# Patient Record
Sex: Female | Born: 1970 | Race: White | Hispanic: No | Smoking: Former smoker
Health system: Southern US, Community
[De-identification: ages and names within clinical notes are randomized; demographics above are authoritative.]

## PROBLEM LIST (undated history)

## (undated) DIAGNOSIS — R51 Headache: Secondary | ICD-10-CM

## (undated) DIAGNOSIS — B999 Unspecified infectious disease: Secondary | ICD-10-CM

## (undated) DIAGNOSIS — C539 Malignant neoplasm of cervix uteri, unspecified: Secondary | ICD-10-CM

## (undated) DIAGNOSIS — J45909 Unspecified asthma, uncomplicated: Secondary | ICD-10-CM

## (undated) DIAGNOSIS — F419 Anxiety disorder, unspecified: Secondary | ICD-10-CM

## (undated) DIAGNOSIS — M797 Fibromyalgia: Secondary | ICD-10-CM

## (undated) HISTORY — PX: FRACTURE SURGERY: SHX138

## (undated) HISTORY — PX: ABDOMINAL HYSTERECTOMY: SHX81

## (undated) HISTORY — PX: KNEE ARTHROSCOPY: SUR90

## (undated) HISTORY — PX: DILATION AND CURETTAGE OF UTERUS: SHX78

---

## 1998-08-20 ENCOUNTER — Other Ambulatory Visit: Admission: RE | Admit: 1998-08-20 | Discharge: 1998-08-20 | Payer: Self-pay | Admitting: Obstetrics & Gynecology

## 2000-01-07 ENCOUNTER — Other Ambulatory Visit: Admission: RE | Admit: 2000-01-07 | Discharge: 2000-01-07 | Payer: Self-pay | Admitting: Obstetrics & Gynecology

## 2000-10-01 ENCOUNTER — Observation Stay (HOSPITAL_COMMUNITY): Admission: EM | Admit: 2000-10-01 | Discharge: 2000-10-02 | Payer: Self-pay

## 2000-10-05 ENCOUNTER — Other Ambulatory Visit (HOSPITAL_COMMUNITY): Admission: RE | Admit: 2000-10-05 | Discharge: 2000-10-22 | Payer: Self-pay | Admitting: Psychiatry

## 2001-03-01 ENCOUNTER — Other Ambulatory Visit: Admission: RE | Admit: 2001-03-01 | Discharge: 2001-03-01 | Payer: Self-pay | Admitting: Obstetrics & Gynecology

## 2002-06-22 ENCOUNTER — Other Ambulatory Visit: Admission: RE | Admit: 2002-06-22 | Discharge: 2002-06-22 | Payer: Self-pay | Admitting: Obstetrics & Gynecology

## 2004-03-21 ENCOUNTER — Other Ambulatory Visit: Admission: RE | Admit: 2004-03-21 | Discharge: 2004-03-21 | Payer: Self-pay | Admitting: Family Medicine

## 2004-07-18 ENCOUNTER — Ambulatory Visit: Payer: Self-pay | Admitting: Family Medicine

## 2004-07-29 ENCOUNTER — Ambulatory Visit: Payer: Self-pay | Admitting: Family Medicine

## 2004-08-01 ENCOUNTER — Ambulatory Visit: Payer: Self-pay | Admitting: Family Medicine

## 2005-05-16 ENCOUNTER — Ambulatory Visit: Payer: Self-pay | Admitting: Internal Medicine

## 2005-08-14 ENCOUNTER — Emergency Department (HOSPITAL_COMMUNITY): Admission: EM | Admit: 2005-08-14 | Discharge: 2005-08-14 | Payer: Self-pay | Admitting: Family Medicine

## 2008-09-08 ENCOUNTER — Ambulatory Visit (HOSPITAL_BASED_OUTPATIENT_CLINIC_OR_DEPARTMENT_OTHER): Admission: RE | Admit: 2008-09-08 | Discharge: 2008-09-08 | Payer: Self-pay | Admitting: Internal Medicine

## 2008-09-08 ENCOUNTER — Ambulatory Visit: Payer: Self-pay | Admitting: Diagnostic Radiology

## 2010-10-11 NOTE — Discharge Summary (Signed)
[place]  Patient:    Jacqueline Greene, Jacqueline Greene                      MRN: [id number] Adm. Date:  [id number] Disc. Date: 10/02/00 Attending:  Carolanne Grumbling D Dictator:   Cornell Barman, P.A. CC:         Evette Georges, M.D. [facility]  Adelene Amas. Williford, M.D.   Discharge Summary  DISCHARGE DIAGNOSES: 1. Suicide attempt. 2. Depression. 3. Drug overdose.  HISTORY OF PRESENT ILLNESS:  Ms. Jacqueline Greene is a 40 year old white female with a long-standing history of depression.  She presented to the emergency room after ingesting a number of medications that she had been taking for a migraine including Promethazine, butalbital and propoxyphene.  The patient had also consumed two glasses of wine earlier.  On arrival, the patient was somnolent and quite combative.  She had been treated aggressively with gastric lavage followed by charcoal.  The patient was admitted for further evaluation.  PAST MEDICAL HISTORY: 1. Long history of depression with a prior suicide attempt at age 15. 2. History of migraines. 3. Status post C-section. 4. Status post sinus surgery x 2. 5. Status post arthroscopic knee surgery.  LABORATORY DATA AND X-RAY FINDINGS:  Blood alcohol level is 35 mg/dl.  BMP was normal.  Salicylate level was less than 4.  Acetaminophen level was 53.4.  HOSPITAL COURSE:  The patient presented with a drug overdose and suicide attempt.  Because of the acetaminophen she had ingested, we did admit her for observation.  Follow up acetaminophen level on May 9, at 11:45 a.m. was 3.2 which was lower than therapeutic range.  We also repeated hepatic function profiles which were normal.  The patient was evaluated by Dr. Jeanie Sewer whose impression was that she had a major depression that was recurrent, rule out post-traumatic stress disorder and alcohol abuse.  He recommended restarting the Celexa at 10 mg q.d. and Wellbutrin 150 mg b.i.d.  He recommended admission to  a psychiatric unit for further evaluation.  DISCHARGE MEDICATIONS: 1. Celexa 2 mg q.d. 2. Wellbutrin SR 150 mg b.i.d.  SPECIAL INSTRUCTIONS:  Instructed not to take Darvocet-N 100, Promethazine or butalbital and additionally any Tylenol until further instructed.  FOLLOWUP:  She is to follow up with Dr. Tawanna Cooler in two to three weeks.  DISPOSITION:  She is to be discharged from here and refer herself for outpatient evaluation. DD:  10/02/00 TD:  10/05/00 Job: [id number]
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
## 2012-02-18 DIAGNOSIS — M1990 Unspecified osteoarthritis, unspecified site: Secondary | ICD-10-CM | POA: Insufficient documentation

## 2013-01-09 ENCOUNTER — Emergency Department (HOSPITAL_COMMUNITY)
Admission: EM | Admit: 2013-01-09 | Discharge: 2013-01-09 | Disposition: A | Discharge status: Home / Self Care | Payer: Self-pay | Source: Home / Self Care | Attending: Emergency Medicine | Admitting: Emergency Medicine

## 2013-01-09 DIAGNOSIS — L255 Unspecified contact dermatitis due to plants, except food: Secondary | ICD-10-CM

## 2013-01-09 DIAGNOSIS — L237 Allergic contact dermatitis due to plants, except food: Secondary | ICD-10-CM

## 2013-01-09 MED ORDER — METHYLPREDNISOLONE ACETATE 80 MG/ML IJ SUSP
INTRAMUSCULAR | Status: AC
  Filled 2013-01-09: qty 1

## 2013-01-09 MED ORDER — PREDNISONE 20 MG PO TABS: ORAL_TABLET | ORAL | Status: DC

## 2013-01-09 MED ORDER — HYDROCORTISONE 1 % EX CREA: TOPICAL_CREAM | CUTANEOUS | Status: DC

## 2013-01-09 MED ORDER — METHYLPREDNISOLONE ACETATE 80 MG/ML IJ SUSP
80.0000 mg | Freq: Once | INTRAMUSCULAR | Status: AC
  Administered 2013-01-09: 80 mg via INTRAMUSCULAR

## 2013-01-09 MED ORDER — TRIAMCINOLONE ACETONIDE 0.1 % EX CREA: TOPICAL_CREAM | CUTANEOUS | Status: DC

## 2013-01-09 NOTE — ED Provider Notes (Addendum)
Chief Complaint:  Rash.   History of Present Illness:   Jacqueline Greene is a 42 year old female who presents with a four-day history of a poison ivy rash. Her daughter's cat ran through poison ivy and she picked up the cat. She didn't have any direct exposure to poison ivy herself. The next day she began to break out in a rash, first on her arms, then on the right side of her neck, then around her right eye. Her right eye was swollen shut this morning. She denies any difficulty breathing, wheezing, or swelling of the lips, tongue, or throat.  Review of Systems:  Other than noted above, the patient denies any of the following symptoms: Systemic:  No fever, chills, sweats, weight loss, or fatigue. ENT:  No nasal congestion, rhinorrhea, sore throat, swelling of lips, tongue or throat. Resp:  No cough, wheezing, or shortness of breath. Skin:  No rash, itching, nodules, or suspicious lesions.  PMFSH:  Past medical history, family history, social history, meds, and allergies were reviewed. She takes BuSpar and Celexa. She has mild intermittent asthma. She is status post hysterectomy.  Physical Exam:   Vital signs:  BP 131/86  Pulse 86  Temp(Src) 98.1 F (36.7 C) (Oral)  Resp 14  SpO2 100% Gen:  Alert, oriented, in no distress. ENT:  Pharynx clear, no intraoral lesions, moist mucous membranes. Lungs:  Clear to auscultation. Skin:  She has streaks and patches of maculopapules and vesicles on both arms, the right side of her neck, and around the right eye. The eye itself appears normal. Skin was otherwise clear.  Course in Urgent Care Center:   Given Depo-Medrol 80 mg IM.  Assessment:  The encounter diagnosis was Poison ivy.  Plan:   1.  The following meds were prescribed:   New Prescriptions   HYDROCORTISONE CREAM 1 %    Apply to rash on face TID   PREDNISONE (DELTASONE) 20 MG TABLET    3 daily for 5 days, 2 daily for 5 days, 1 daily for 5 days   TRIAMCINOLONE CREAM (KENALOG) 0.1 %     Apply to rash on body TID   2.  The patient was instructed in symptomatic care and handouts were given. 3.  The patient was told to return if becoming worse in any way, if no better in 3 or 4 days, and given some red flag symptoms such as worsening rash or any difficulty breathing that would indicate earlier return. 4.  Follow up here if needed.     Reuben Likes, MD 01/09/13 0981  Reuben Likes, MD 01/09/13 [phone]

## 2013-02-17 ENCOUNTER — Inpatient Hospital Stay (HOSPITAL_COMMUNITY)
Admission: AD | Admit: 2013-02-17 | Discharge: 2013-02-17 | Disposition: A | Discharge status: Home / Self Care | Payer: Self-pay | Source: Ambulatory Visit | Attending: Obstetrics & Gynecology | Admitting: Obstetrics & Gynecology

## 2013-02-17 ENCOUNTER — Inpatient Hospital Stay (HOSPITAL_COMMUNITY): Payer: Self-pay

## 2013-02-17 ENCOUNTER — Encounter (HOSPITAL_COMMUNITY): Payer: Self-pay | Admitting: *Deleted

## 2013-02-17 DIAGNOSIS — N83201 Unspecified ovarian cyst, right side: Secondary | ICD-10-CM

## 2013-02-17 DIAGNOSIS — N898 Other specified noninflammatory disorders of vagina: Secondary | ICD-10-CM | POA: Insufficient documentation

## 2013-02-17 DIAGNOSIS — Z90710 Acquired absence of both cervix and uterus: Secondary | ICD-10-CM | POA: Insufficient documentation

## 2013-02-17 DIAGNOSIS — N949 Unspecified condition associated with female genital organs and menstrual cycle: Secondary | ICD-10-CM | POA: Insufficient documentation

## 2013-02-17 DIAGNOSIS — R109 Unspecified abdominal pain: Secondary | ICD-10-CM | POA: Insufficient documentation

## 2013-02-17 HISTORY — DX: Anxiety disorder, unspecified: F41.9

## 2013-02-17 HISTORY — DX: Malignant neoplasm of cervix uteri, unspecified: C53.9

## 2013-02-17 HISTORY — DX: Headache: R51

## 2013-02-17 HISTORY — DX: Unspecified infectious disease: B99.9

## 2013-02-17 HISTORY — DX: Unspecified asthma, uncomplicated: J45.909

## 2013-02-17 HISTORY — DX: Fibromyalgia: M79.7

## 2013-02-17 LAB — WET PREP, GENITAL: Clue Cells Wet Prep HPF POC: NONE SEEN

## 2013-02-17 LAB — URINALYSIS, ROUTINE W REFLEX MICROSCOPIC
Bilirubin Urine: NEGATIVE
Hgb urine dipstick: NEGATIVE
Ketones, ur: NEGATIVE mg/dL
Nitrite: NEGATIVE
Urobilinogen, UA: 0.2 mg/dL (ref 0.0–1.0)

## 2013-02-17 MED ORDER — IBUPROFEN 600 MG PO TABS: 600.0000 mg | ORAL_TABLET | Freq: Four times a day (QID) | ORAL | Status: DC | PRN

## 2013-02-17 NOTE — MAU Provider Note (Signed)
History     CSN: [id number]  Arrival date and time: 02/17/13 1341   First Provider Initiated Contact with Patient 02/17/13 1558      Chief Complaint  Patient presents with  . Abdominal Cramping  . Vaginal Bleeding   HPI This is a 42 y.o. female who presents with c/o having brown discharge in underwear this morning. Is convinced it was vaginal, though she has been having diarrhea for a day or so. Had a hysterectomy by Dr Shawnie Pons 4-5 yrs ago for Cervical Cancer.   RN Note: Woke up with brown/grainy D/c from vagina noted in underwear. Continued into the morning. None when went to br now. Having crampy feels puny. Hysterectomy 5 yrs for cervical cancer; changes in bleeding; Still have ovaries.      OB History   Grav Para Term Preterm Abortions TAB SAB Ect Mult Living   3 1 1  2  2   1       Past Medical History  Diagnosis Date  . Headache(784.0)   . Asthma   . Infection     UTI  . Fibromyalgia   . Cervical cancer   . Anxiety     Past Surgical History  Procedure Laterality Date  . Abdominal hysterectomy    . Cesarean section    . Dilation and curettage of uterus    . Fracture surgery      shattered knee cap  . Knee arthroscopy      Family History  Problem Relation Age of Onset  . Anxiety disorder Mother   . Hypertension Father   . Alcohol abuse Father   . Heart disease Paternal Uncle   . Cancer Maternal Grandmother     breast  . Heart disease Maternal Grandmother   . Diabetes Paternal Grandmother   . Heart disease Paternal Grandmother     History  Substance Use Topics  . Smoking status: Former Games developer  . Smokeless tobacco: Current User     Comment: e cig - 3 mg/ 4years  . Alcohol Use: No    Allergies: No Known Allergies  Prescriptions prior to admission  Medication Sig Dispense Refill  . busPIRone (BUSPAR) 10 MG tablet Take 10 mg by mouth 2 (two) times daily.      . citalopram (CELEXA) 20 MG tablet Take 20 mg by mouth at bedtime.      Marland Kitchen ibuprofen  (ADVIL,MOTRIN) 200 MG tablet Take 400-800 mg by mouth every 6 (six) hours as needed for pain.        Review of Systems  Constitutional: Negative for fever, chills and malaise/fatigue.  Gastrointestinal: Positive for diarrhea. Negative for nausea, vomiting, abdominal pain and constipation.  Genitourinary: Negative for dysuria.  Neurological: Negative for dizziness.   Physical Exam   Blood pressure 130/87, pulse 65, temperature 98.7 F (37.1 C), temperature source Oral, resp. rate 16, height 5' 9.5" (1.765 m), weight 68.493 kg (151 lb), SpO2 100.00%.  Physical Exam  Constitutional: She is oriented to person, place, and time. She appears well-developed and well-nourished.  HENT:  Head: Normocephalic.  Cardiovascular: Normal rate.   Respiratory: Effort normal.  GI: Soft. She exhibits no distension and no mass. There is no tenderness. There is no rebound and no guarding.  Genitourinary: Vaginal discharge (creamy white discharge, no colored discharge at all, cuff intact, no erethema or lesions) found.  Musculoskeletal: Normal range of motion.  Neurological: She is alert and oriented to person, place, and time.  Skin: Skin is warm and dry.  Psychiatric: She has a normal mood and affect.    MAU Course  Procedures  MDM Results for orders placed during the hospital encounter of 02/17/13 (from the past 72 hour(s))  URINALYSIS, ROUTINE W REFLEX MICROSCOPIC     Status: Abnormal   Collection Time    02/17/13  2:00 PM      Result Value Range   Color, Urine YELLOW  YELLOW   APPearance CLEAR  CLEAR   Specific Gravity, Urine >1.030 (*) 1.005 - 1.030   pH 6.0  5.0 - 8.0   Glucose, UA NEGATIVE  NEGATIVE mg/dL   Hgb urine dipstick NEGATIVE  NEGATIVE   Bilirubin Urine NEGATIVE  NEGATIVE   Ketones, ur NEGATIVE  NEGATIVE mg/dL   Protein, ur NEGATIVE  NEGATIVE mg/dL   Urobilinogen, UA 0.2  0.0 - 1.0 mg/dL   Nitrite NEGATIVE  NEGATIVE   Leukocytes, UA NEGATIVE  NEGATIVE   Comment: MICROSCOPIC  NOT DONE ON URINES WITH NEGATIVE PROTEIN, BLOOD, LEUKOCYTES, NITRITE, OR GLUCOSE <1000 mg/dL.  WET PREP, GENITAL     Status: Abnormal   Collection Time    02/17/13  4:10 PM      Result Value Range   Yeast Wet Prep HPF POC NONE SEEN  NONE SEEN   Trich, Wet Prep NONE SEEN  NONE SEEN   Clue Cells Wet Prep HPF POC NONE SEEN  NONE SEEN   WBC, Wet Prep HPF POC FEW (*) NONE SEEN   Comment: MANY BACTERIA SEEN  US Pelvis Complete  02/17/2013   CLINICAL DATA:  Pelvic pain and vaginal bleeding. Post hysterectomy.  EXAM: TRANSABDOMINAL AND TRANSVAGINAL ULTRASOUND OF PELVIS  TECHNIQUE: Both transabdominal and transvaginal ultrasound examinations of the pelvis were performed. Transabdominal technique was performed for global imaging of the pelvis including uterus, ovaries, adnexal regions, and pelvic cul-de-sac. It was necessary to proceed with endovaginal exam following the transabdominal exam to visualize the ovaries and adnexa.  COMPARISON:  No similar comparison exam.  FINDINGS: Uterus  Measurements: Surgically absent. No mass at the vaginal cuff.  Endometrium  Thickness: Surgically absent.  Right ovary  Measurements: 2.4 x 1.9 x 1.5 cm. Follicles are incidentally noted.  Left ovary  Measurements: 1.6 x 1.2 x 1.0 cm. Follicles are incidentally noted.  Other findings  Trace free fluid noted.  IMPRESSION: Normal post hysterectomy appearance. No acute abnormality.   Electronically Signed   By: Christiana Pellant M.D.   On: 02/17/2013 19:40     Assessment and Plan  A:  Unknown source of brown discharge      Suspect this was from diarrhea  P:  Discharge home       Reassured probably diarrhea       IBS discussed, recommend starting probiotics  [facility] 02/17/2013, 4:19 PM

## 2013-02-17 NOTE — MAU Note (Signed)
Patient states she had a hysterectomy 5 years ago. States she started having abdominal cramping and states her panties were full of coffee ground dark blood with a "sour" smell. States the cramping continues but sure of bleeding.

## 2013-02-17 NOTE — MAU Note (Signed)
Woke up with brown/grainy  D/c from vagina noted in underwear. Continued into the morning. None when went to br now.  Having crampy feels puny.  Hysterectomy 5 yrs for cervical cancer; changes in bleeding;  Still have ovaries.

## 2013-02-21 NOTE — MAU Provider Note (Signed)
Attestation of Attending Supervision of Advanced Practitioner (PA/CNM/NP): Evaluation and management procedures were performed by the Advanced Practitioner under my supervision and collaboration.  I have reviewed the Advanced Practitioner's note and chart, and I agree with the management and plan.  Zanayah Shadowens, MD, FACOG Attending Obstetrician & Gynecologist Faculty Practice, [facility]  

## 2014-03-27 ENCOUNTER — Encounter (HOSPITAL_COMMUNITY): Payer: Self-pay | Admitting: *Deleted

## 2015-10-22 ENCOUNTER — Emergency Department (HOSPITAL_COMMUNITY): Payer: No Typology Code available for payment source

## 2015-10-22 ENCOUNTER — Inpatient Hospital Stay (HOSPITAL_COMMUNITY)
Admission: EM | Admit: 2015-10-22 | Discharge: 2015-10-26 | DRG: 885 | Disposition: A | Discharge status: Other Acute Inpatient Hospital | Payer: No Typology Code available for payment source | Attending: Internal Medicine | Admitting: Internal Medicine

## 2015-10-22 ENCOUNTER — Encounter (HOSPITAL_COMMUNITY): Payer: Self-pay | Admitting: *Deleted

## 2015-10-22 DIAGNOSIS — F429 Obsessive-compulsive disorder, unspecified: Secondary | ICD-10-CM | POA: Diagnosis present

## 2015-10-22 DIAGNOSIS — T424X2A Poisoning by benzodiazepines, intentional self-harm, initial encounter: Secondary | ICD-10-CM | POA: Diagnosis present

## 2015-10-22 DIAGNOSIS — G312 Degeneration of nervous system due to alcohol: Secondary | ICD-10-CM | POA: Diagnosis present

## 2015-10-22 DIAGNOSIS — Z8541 Personal history of malignant neoplasm of cervix uteri: Secondary | ICD-10-CM

## 2015-10-22 DIAGNOSIS — Z833 Family history of diabetes mellitus: Secondary | ICD-10-CM

## 2015-10-22 DIAGNOSIS — G4700 Insomnia, unspecified: Secondary | ICD-10-CM | POA: Diagnosis present

## 2015-10-22 DIAGNOSIS — R402432 Glasgow coma scale score 3-8, at arrival to emergency department: Secondary | ICD-10-CM | POA: Diagnosis present

## 2015-10-22 DIAGNOSIS — Y908 Blood alcohol level of 240 mg/100 ml or more: Secondary | ICD-10-CM | POA: Diagnosis present

## 2015-10-22 DIAGNOSIS — Z720 Tobacco use: Secondary | ICD-10-CM | POA: Diagnosis not present

## 2015-10-22 DIAGNOSIS — I952 Hypotension due to drugs: Secondary | ICD-10-CM | POA: Diagnosis present

## 2015-10-22 DIAGNOSIS — F419 Anxiety disorder, unspecified: Secondary | ICD-10-CM | POA: Diagnosis present

## 2015-10-22 DIAGNOSIS — F319 Bipolar disorder, unspecified: Secondary | ICD-10-CM

## 2015-10-22 DIAGNOSIS — T1491 Suicide attempt: Secondary | ICD-10-CM

## 2015-10-22 DIAGNOSIS — Z818 Family history of other mental and behavioral disorders: Secondary | ICD-10-CM | POA: Diagnosis not present

## 2015-10-22 DIAGNOSIS — Z915 Personal history of self-harm: Secondary | ICD-10-CM

## 2015-10-22 DIAGNOSIS — Z8249 Family history of ischemic heart disease and other diseases of the circulatory system: Secondary | ICD-10-CM | POA: Diagnosis not present

## 2015-10-22 DIAGNOSIS — Z803 Family history of malignant neoplasm of breast: Secondary | ICD-10-CM

## 2015-10-22 DIAGNOSIS — E876 Hypokalemia: Secondary | ICD-10-CM | POA: Diagnosis present

## 2015-10-22 DIAGNOSIS — T43222A Poisoning by selective serotonin reuptake inhibitors, intentional self-harm, initial encounter: Secondary | ICD-10-CM | POA: Diagnosis present

## 2015-10-22 DIAGNOSIS — F1010 Alcohol abuse, uncomplicated: Secondary | ICD-10-CM | POA: Diagnosis present

## 2015-10-22 DIAGNOSIS — J9601 Acute respiratory failure with hypoxia: Secondary | ICD-10-CM | POA: Diagnosis present

## 2015-10-22 DIAGNOSIS — I4581 Long QT syndrome: Secondary | ICD-10-CM | POA: Diagnosis present

## 2015-10-22 DIAGNOSIS — F314 Bipolar disorder, current episode depressed, severe, without psychotic features: Secondary | ICD-10-CM | POA: Diagnosis present

## 2015-10-22 DIAGNOSIS — G92 Toxic encephalopathy: Secondary | ICD-10-CM | POA: Diagnosis present

## 2015-10-22 DIAGNOSIS — J9602 Acute respiratory failure with hypercapnia: Secondary | ICD-10-CM

## 2015-10-22 DIAGNOSIS — T426X2A Poisoning by other antiepileptic and sedative-hypnotic drugs, intentional self-harm, initial encounter: Secondary | ICD-10-CM | POA: Diagnosis present

## 2015-10-22 DIAGNOSIS — Z811 Family history of alcohol abuse and dependence: Secondary | ICD-10-CM

## 2015-10-22 DIAGNOSIS — Z79899 Other long term (current) drug therapy: Secondary | ICD-10-CM

## 2015-10-22 DIAGNOSIS — J9600 Acute respiratory failure, unspecified whether with hypoxia or hypercapnia: Secondary | ICD-10-CM

## 2015-10-22 DIAGNOSIS — T510X1A Toxic effect of ethanol, accidental (unintentional), initial encounter: Secondary | ICD-10-CM | POA: Diagnosis present

## 2015-10-22 DIAGNOSIS — T50902A Poisoning by unspecified drugs, medicaments and biological substances, intentional self-harm, initial encounter: Secondary | ICD-10-CM | POA: Diagnosis present

## 2015-10-22 DIAGNOSIS — J45909 Unspecified asthma, uncomplicated: Secondary | ICD-10-CM | POA: Diagnosis present

## 2015-10-22 LAB — COMPREHENSIVE METABOLIC PANEL
ALK PHOS: 79 U/L (ref 38–126)
ALT: 33 U/L (ref 14–54)
ALT: 25 U/L (ref 14–54)
ANION GAP: 9 (ref 5–15)
AST: 37 U/L (ref 15–41)
AST: 24 U/L (ref 15–41)
Albumin: 4 g/dL (ref 3.5–5.0)
Albumin: 3.3 g/dL — ABNORMAL LOW (ref 3.5–5.0)
Alkaline Phosphatase: 98 U/L (ref 38–126)
Anion gap: 6 (ref 5–15)
BILIRUBIN TOTAL: 0.8 mg/dL (ref 0.3–1.2)
BILIRUBIN TOTAL: 0.6 mg/dL (ref 0.3–1.2)
BUN: 10 mg/dL (ref 6–20)
BUN: 8 mg/dL (ref 6–20)
CALCIUM: 7 mg/dL — AB (ref 8.9–10.3)
CHLORIDE: 106 mmol/L (ref 101–111)
CO2: 24 mmol/L (ref 22–32)
CO2: 21 mmol/L — ABNORMAL LOW (ref 22–32)
CREATININE: 0.77 mg/dL (ref 0.44–1.00)
Calcium: 8.3 mg/dL — ABNORMAL LOW (ref 8.9–10.3)
Chloride: 118 mmol/L — ABNORMAL HIGH (ref 101–111)
Creatinine, Ser: 0.83 mg/dL (ref 0.44–1.00)
GFR calc Af Amer: >60 mL/min (ref >60)
GFR calc Af Amer: >60 mL/min (ref >60)
Glucose, Bld: 142 mg/dL — ABNORMAL HIGH (ref 65–99)
Glucose, Bld: 120 mg/dL — ABNORMAL HIGH (ref 65–99)
POTASSIUM: 3.8 mmol/L (ref 3.5–5.1)
POTASSIUM: 4 mmol/L (ref 3.5–5.1)
Sodium: 139 mmol/L (ref 135–145)
Sodium: 145 mmol/L (ref 135–145)
TOTAL PROTEIN: 7.6 g/dL (ref 6.5–8.1)
TOTAL PROTEIN: 5.9 g/dL — AB (ref 6.5–8.1)

## 2015-10-22 LAB — CBC WITH DIFFERENTIAL/PLATELET
BASOS ABS: 0.1 10*3/uL (ref 0.0–0.1)
Basophils Relative: 1 %
EOS PCT: 6 %
Eosinophils Absolute: 0.6 10*3/uL (ref 0.0–0.7)
HEMATOCRIT: 42.5 % (ref 36.0–46.0)
HEMOGLOBIN: 14.9 g/dL (ref 12.0–15.0)
LYMPHS ABS: 4.9 10*3/uL — AB (ref 0.7–4.0)
LYMPHS PCT: 48 %
MCH: 30.5 pg (ref 26.0–34.0)
MCHC: 35.1 g/dL (ref 30.0–36.0)
MCV: 86.9 fL (ref 78.0–100.0)
Monocytes Absolute: 0.7 10*3/uL (ref 0.1–1.0)
Monocytes Relative: 7 %
NEUTROS ABS: 3.8 10*3/uL (ref 1.7–7.7)
NEUTROS PCT: 38 %
PLATELETS: 248 10*3/uL (ref 150–400)
RBC: 4.89 MIL/uL (ref 3.87–5.11)
RDW: 13.2 % (ref 11.5–15.5)
WBC: 10 10*3/uL (ref 4.0–10.5)

## 2015-10-22 LAB — I-STAT CHEM 8, ED
BUN: 12 mg/dL (ref 6–20)
CALCIUM ION: 1 mmol/L — AB (ref 1.12–1.23)
CREATININE: 1.2 mg/dL — AB (ref 0.44–1.00)
Chloride: 106 mmol/L (ref 101–111)
Glucose, Bld: 136 mg/dL — ABNORMAL HIGH (ref 65–99)
HEMATOCRIT: 42 % (ref 36.0–46.0)
HEMOGLOBIN: 14.3 g/dL (ref 12.0–15.0)
Potassium: 4.3 mmol/L (ref 3.5–5.1)
SODIUM: 143 mmol/L (ref 135–145)
TCO2: 24 mmol/L (ref 0–100)

## 2015-10-22 LAB — URINALYSIS, ROUTINE W REFLEX MICROSCOPIC
Bilirubin Urine: NEGATIVE
Glucose, UA: NEGATIVE mg/dL
HGB URINE DIPSTICK: NEGATIVE
Ketones, ur: NEGATIVE mg/dL
NITRITE: NEGATIVE
PROTEIN: NEGATIVE mg/dL
SPECIFIC GRAVITY, URINE: 1.006 (ref 1.005–1.030)
pH: 5 (ref 5.0–8.0)

## 2015-10-22 LAB — BLOOD GAS, ARTERIAL
Acid-base deficit: 2.7 mmol/L — ABNORMAL HIGH (ref 0.0–2.0)
Bicarbonate: 22.4 mEq/L (ref 20.0–24.0)
DRAWN BY: 276051
FIO2: 0.5
MECHVT: 530 mL
O2 Saturation: 96.8 %
PATIENT TEMPERATURE: 95.4
PCO2 ART: 38.6 mmHg (ref 35.0–45.0)
PEEP: 5 cmH2O
PO2 ART: 94.3 mmHg (ref 80.0–100.0)
RATE: 14 resp/min
TCO2: 20.2 mmol/L (ref 0–100)
pH, Arterial: 7.371 (ref 7.350–7.450)

## 2015-10-22 LAB — RAPID URINE DRUG SCREEN, HOSP PERFORMED
AMPHETAMINES: NOT DETECTED
BENZODIAZEPINES: POSITIVE — AB
Barbiturates: NOT DETECTED
Cocaine: NOT DETECTED
OPIATES: NOT DETECTED
Tetrahydrocannabinol: NOT DETECTED

## 2015-10-22 LAB — AMMONIA: Ammonia: 38 umol/L — ABNORMAL HIGH (ref 9–35)

## 2015-10-22 LAB — I-STAT CG4 LACTIC ACID, ED: Lactic Acid, Venous: 2.99 mmol/L (ref 0.5–2.0)

## 2015-10-22 LAB — URINE MICROSCOPIC-ADD ON

## 2015-10-22 LAB — ETHANOL: Alcohol, Ethyl (B): 271 mg/dL — ABNORMAL HIGH (ref <5)

## 2015-10-22 LAB — SALICYLATE LEVEL

## 2015-10-22 LAB — PROCALCITONIN

## 2015-10-22 LAB — PHOSPHORUS: Phosphorus: 2.6 mg/dL (ref 2.5–4.6)

## 2015-10-22 LAB — MRSA PCR SCREENING: MRSA BY PCR: NEGATIVE

## 2015-10-22 LAB — ACETAMINOPHEN LEVEL

## 2015-10-22 LAB — MAGNESIUM: MAGNESIUM: 2.4 mg/dL (ref 1.7–2.4)

## 2015-10-22 LAB — CBG MONITORING, ED: GLUCOSE-CAPILLARY: 151 mg/dL — AB (ref 65–99)

## 2015-10-22 MED ORDER — DEXTROSE 5 % IV SOLN
30.0000 ug/min | INTRAVENOUS | Status: DC
  Administered 2015-10-22: 25 ug/min via INTRAVENOUS
  Filled 2015-10-22: qty 4

## 2015-10-22 MED ORDER — PROPOFOL 1000 MG/100ML IV EMUL: 0.0000 ug/kg/min | INTRAVENOUS | Status: DC

## 2015-10-22 MED ORDER — FENTANYL CITRATE (PF) 100 MCG/2ML IJ SOLN
100.0000 ug | INTRAMUSCULAR | Status: AC | PRN
  Administered 2015-10-22 – 2015-10-23 (×3): 100 ug via INTRAVENOUS
  Filled 2015-10-22 (×3): qty 2

## 2015-10-22 MED ORDER — SODIUM CHLORIDE 0.9 % IV SOLN: 250.0000 mL | INTRAVENOUS | Status: DC | PRN

## 2015-10-22 MED ORDER — SODIUM CHLORIDE 0.9 % IV BOLUS (SEPSIS)
1000.0000 mL | Freq: Once | INTRAVENOUS | Status: AC
  Administered 2015-10-22: 1000 mL via INTRAVENOUS

## 2015-10-22 MED ORDER — PHENYLEPHRINE HCL 10 MG/ML IJ SOLN
30.0000 ug/min | INTRAVENOUS | Status: DC
  Administered 2015-10-22: 30 ug/min via INTRAVENOUS
  Filled 2015-10-22 (×3): qty 1

## 2015-10-22 MED ORDER — ENOXAPARIN SODIUM 40 MG/0.4ML ~~LOC~~ SOLN
40.0000 mg | SUBCUTANEOUS | Status: DC
  Administered 2015-10-22 – 2015-10-25 (×4): 40 mg via SUBCUTANEOUS
  Filled 2015-10-22 (×4): qty 0.4

## 2015-10-22 MED ORDER — SODIUM CHLORIDE 0.9 % IV BOLUS (SEPSIS): 1000.0000 mL | Freq: Once | INTRAVENOUS | Status: DC

## 2015-10-22 MED ORDER — FENTANYL CITRATE (PF) 100 MCG/2ML IJ SOLN: 100.0000 ug | INTRAMUSCULAR | Status: DC | PRN

## 2015-10-22 MED ORDER — ANTISEPTIC ORAL RINSE SOLUTION (CORINZ)
7.0000 mL | Freq: Four times a day (QID) | OROMUCOSAL | Status: DC
  Administered 2015-10-22 – 2015-10-23 (×3): 7 mL via OROMUCOSAL

## 2015-10-22 MED ORDER — SODIUM CHLORIDE 0.9 % IV SOLN
Freq: Once | INTRAVENOUS | Status: AC
  Administered 2015-10-22: 12:00:00 via INTRAVENOUS

## 2015-10-22 MED ORDER — CHLORHEXIDINE GLUCONATE 0.12% ORAL RINSE (MEDLINE KIT)
15.0000 mL | Freq: Two times a day (BID) | OROMUCOSAL | Status: DC
Start: 2015-10-22 — End: 2015-10-23
  Administered 2015-10-22 – 2015-10-23 (×4): 15 mL via OROMUCOSAL

## 2015-10-22 MED ORDER — ONDANSETRON HCL 4 MG/2ML IJ SOLN: 4.0000 mg | Freq: Four times a day (QID) | INTRAMUSCULAR | Status: DC | PRN

## 2015-10-22 MED ORDER — ALBUTEROL SULFATE (2.5 MG/3ML) 0.083% IN NEBU: 2.5000 mg | INHALATION_SOLUTION | RESPIRATORY_TRACT | Status: DC | PRN

## 2015-10-22 MED ORDER — MAGNESIUM SULFATE 2 GM/50ML IV SOLN
2.0000 g | Freq: Once | INTRAVENOUS | Status: AC
  Administered 2015-10-22: 2 g via INTRAVENOUS
  Filled 2015-10-22: qty 50

## 2015-10-22 MED ORDER — NALOXONE HCL 2 MG/2ML IJ SOSY
2.0000 mg | PREFILLED_SYRINGE | Freq: Once | INTRAMUSCULAR | Status: AC
  Administered 2015-10-22: 2 mg via INTRAVENOUS

## 2015-10-22 MED ORDER — THIAMINE HCL 100 MG/ML IJ SOLN
100.0000 mg | Freq: Every day | INTRAMUSCULAR | Status: DC
  Administered 2015-10-22 – 2015-10-25 (×4): 100 mg via INTRAVENOUS
  Filled 2015-10-22 (×4): qty 2

## 2015-10-22 MED ORDER — FENTANYL CITRATE (PF) 100 MCG/2ML IJ SOLN
100.0000 ug | INTRAMUSCULAR | Status: DC | PRN
Start: 2015-10-22 — End: 2015-10-23
  Administered 2015-10-22 – 2015-10-23 (×4): 100 ug via INTRAVENOUS
  Filled 2015-10-22 (×4): qty 2

## 2015-10-22 MED ORDER — FOLIC ACID 5 MG/ML IJ SOLN
1.0000 mg | Freq: Every day | INTRAMUSCULAR | Status: DC
  Administered 2015-10-22 – 2015-10-25 (×4): 1 mg via INTRAVENOUS
  Filled 2015-10-22 (×4): qty 0.2

## 2015-10-22 MED ORDER — SODIUM CHLORIDE 0.9 % IV SOLN
INTRAVENOUS | Status: DC
  Administered 2015-10-22: 1000 mL via INTRAVENOUS
  Administered 2015-10-23 – 2015-10-26 (×9): via INTRAVENOUS

## 2015-10-22 MED ORDER — ACETAMINOPHEN 325 MG PO TABS
650.0000 mg | ORAL_TABLET | ORAL | Status: DC | PRN
  Administered 2015-10-22 – 2015-10-25 (×2): 650 mg via ORAL
  Filled 2015-10-22 (×3): qty 2

## 2015-10-22 MED ORDER — ACTIDOSE WITH SORBITOL 50 GM/240ML PO LIQD
100.0000 g | Freq: Once | ORAL | Status: AC
  Administered 2015-10-22: 100 g
  Filled 2015-10-22: qty 480

## 2015-10-22 MED ORDER — ROCURONIUM BROMIDE 50 MG/5ML IV SOLN
85.0000 mg | Freq: Once | INTRAVENOUS | Status: DC
  Filled 2015-10-22: qty 8.5

## 2015-10-22 MED ORDER — DEXMEDETOMIDINE HCL IN NACL 200 MCG/50ML IV SOLN
0.0000 ug/kg/h | INTRAVENOUS | Status: DC
  Administered 2015-10-22 (×2): 0.4 ug/kg/h via INTRAVENOUS
  Filled 2015-10-22 (×2): qty 50

## 2015-10-22 MED ORDER — FAMOTIDINE IN NACL 20-0.9 MG/50ML-% IV SOLN
20.0000 mg | Freq: Two times a day (BID) | INTRAVENOUS | Status: DC
  Administered 2015-10-22 – 2015-10-25 (×7): 20 mg via INTRAVENOUS
  Filled 2015-10-22 (×7): qty 50

## 2015-10-22 MED ORDER — ETOMIDATE 2 MG/ML IV SOLN: 20.0000 mg | Freq: Once | INTRAVENOUS | Status: DC

## 2015-10-22 NOTE — Progress Notes (Signed)
Initial Nutrition Assessment  DOCUMENTATION CODES:   Obesity unspecified  INTERVENTION:  - If pt to remain intubated >/= 24 hours, recommend Vital AF 1.2 @ 45 mL/hr with 30 mL Prostat BID; this regimen will provide 1496 kcal, 111 grams of protein, and 876 mL free water. - RD will follow-up 5/30  NUTRITION DIAGNOSIS:   Inadequate oral intake related to inability to eat as evidenced by NPO status.  GOAL:   Patient will meet greater than or equal to 90% of their needs  MONITOR:   Vent status, Weight trends, Labs, I & O's  REASON FOR ASSESSMENT:   Ventilator  ASSESSMENT:   45 year old female with a past medical history significant for alcohol abuse, bipolar disorder and a history of suicide attempt in the past who was brought to the [facility] emergency department on 10/22/2015. She had been struggling with insomnia and her family felt that her bipolar disorder was not very well controlled in the last several weeks. However, they had no suspicion of her considering suicide. They had been in contact with her as recent as late in the evening on 10/21/2015. Her sister-in-law received a text message at 7:03 AM on 10/22/2015 indicating that she had made a suicide attempt. She did not reveal which medication she took. There were empty bottles of Klonopin and Lamictal noted nearby. She also had prescriptions for buspirone, as omeprazole, naproxen, and fluoxetine.  Pt seen for new vent. BMI indicates obesity. Spoke with ex-sister-in-law who is at bedside. She reports that pt had a good appetite PTA and focused on eating healthy foods and homeopathy. She states that medications were recently adjusted and that pt had been gaining weight related to this; she is unsure of pt's UBW or amount of weight gained.   Patient is currently intubated on ventilator support with OGT in place.  MV: 7.4 L/min Temp (24hrs), Avg:95.7 F (35.4 C), Min:95.2 F (35.1 C), Max:96.3 F (35.7 C) Propofol:  none  Did not perform physical assessment at this time with bair hugger in place; will complete tomorrow if able. No recent weight hx available in the chart (last recorded weight was from 2014). TF recommendations outlined above should they be needed. Medications reviewed. Labs reviewed; creatinine elevated, ionized Ca low: 1 mmol/L, ammonia elevated: 3.8 umol/L.  IVF: NS @ 150 mL/hr. Drip: Neo @ 30 mcg/min.    Diet Order:  Diet NPO time specified  Skin:  Reviewed, no issues  Last BM:  PTA  Height:   Ht Readings from Last 1 Encounters:  10/22/15 5\' 4"  (1.626 m)    Weight:   Wt Readings from Last 1 Encounters:  10/22/15 182 lb 15.7 oz (83 kg)    Ideal Body Weight:  54.54 kg (kg)  BMI:  Body mass index is 31.39 kg/(m^2).  Estimated Nutritional Needs:   Kcal:  1460  Protein:  100-124 grams (1.2-1.5 grams/kg)  Fluid:  >/= 1.5 L/day  EDUCATION NEEDS:   No education needs identified at this time     Jarome Matin, RD, LDN Inpatient Clinical Dietitian Pager # [phone] After hours/weekend pager # [phone]

## 2015-10-22 NOTE — ED Notes (Addendum)
Lactic acid i stAT pending due to equipment error. Will redraw and run test. Tech and Rn aware.

## 2015-10-22 NOTE — Progress Notes (Signed)
eLink Physician-Brief Progress Note Patient Name: YAIRETH JOPLIN DOB: [birth year] MRN: [id number]   Date of Service  10/22/2015  HPI/Events of Note  Ow[place] Shark secretions in ETT. ? aspiration   eICU Interventions  Check cultures, procalcitonin, repeat CXR. Low threshold to start antibiotics.     Intervention Category Intermediate Interventions: Other:  Saladin Petrelli 10/22/2015, 7:26 PM

## 2015-10-22 NOTE — ED Notes (Signed)
MD at bedside. 

## 2015-10-22 NOTE — H&P (Signed)
PULMONARY / CRITICAL CARE MEDICINE   Name: Jacqueline Greene MRN: [id number] DOB: Aug 05, 1970    ADMISSION DATE:  10/22/2015 CONSULTATION DATE:  10/22/2015  REFERRING MD:  ER physician  CHIEF COMPLAINT:  Found down, drug overdose  HISTORY OF PRESENT ILLNESS:   This is a 45 year old female with a past medical history significant for alcohol abuse, bipolar disorder and a history of suicide attempt in the past who was brought to the [facility] emergency department on 10/22/2015. She had been struggling with insomnia and her family felt that her bipolar disorder was not very well controlled in the last several weeks. However, they had no suspicion of her considering suicide. They had been in contact with her as recent as late in the evening on 10/21/2015. Her sister-in-law received a text message at 7:03 AM on 10/22/2015 indicating that she had made a suicide attempt. She did not reveal which medication she took. There were empty bottles of Klonopin and Lamictal noted nearby. She also had prescriptions for buspirone, as omeprazole, naproxen, and fluoxetine.  PAST MEDICAL HISTORY :  She  has a past medical history of Headache(784.0); Asthma; Infection; Fibromyalgia; Cervical cancer ([place]); and Anxiety.  PAST SURGICAL HISTORY: She  has past surgical history that includes Abdominal hysterectomy; Cesarean section; Dilation and curettage of uterus; Fracture surgery; and Knee arthroscopy.  No Known Allergies  No current facility-administered medications on file prior to encounter.   Current Outpatient Prescriptions on File Prior to Encounter  Medication Sig  . busPIRone (BUSPAR) 10 MG tablet Take 10 mg by mouth 2 (two) times daily.  . citalopram (CELEXA) 20 MG tablet Take 20 mg by mouth at bedtime.  Marland Kitchen ibuprofen (ADVIL,MOTRIN) 200 MG tablet Take 400-800 mg by mouth every 6 (six) hours as needed for pain.  Marland Kitchen ibuprofen (ADVIL,MOTRIN) 600 MG tablet Take 1 tablet (600 mg total) by mouth every 6 (six)  hours as needed for pain.    FAMILY HISTORY/SOCIAL HISTORY/REVIEW OF SYSTEMS:   Cannot obtain due to intubation  SUBJECTIVE:  As above  VITAL SIGNS: BP 85/58 mmHg  Pulse 78  Temp(Src) 95.4 F (35.2 C)  Resp 14  Ht 5\' 9"  (1.753 m)  SpO2 100%  LMP   HEMODYNAMICS:    VENTILATOR SETTINGS: Vent Mode:  [-] PRVC FiO2 (%):  [50 %-100 %] 100 % Set Rate:  [14 bmp] 14 bmp Vt Set:  [530 mL] 530 mL PEEP:  [5 cmH20] 5 cmH20 Plateau Pressure:  [18 cmH20] 18 cmH20  INTAKE / OUTPUT:    PHYSICAL EXAMINATION: General:  On vent Neuro:  No response to external stimuli HEENT:  NCAT PERRL, ETT in place Cardiovascular:  RRR, no mgr Lungs:  CTA A, vent supported breaths Abdomen:  BS+, soft, nontender Musculoskeletal:  Normal bulk and tone Skin:  No rash or skin breakdown  LABS:  BMET  Recent Labs Lab 10/22/15 0844 10/22/15 0855  NA 139 143  K 3.8 4.3  CL 106 106  CO2 24  --   BUN 10 12  CREATININE 0.83 1.20*  GLUCOSE 142* 136*    Electrolytes  Recent Labs Lab 10/22/15 0844  CALCIUM 8.3*    CBC  Recent Labs Lab 10/22/15 0844 10/22/15 0855  WBC 10.0  --   HGB 14.9 14.3  HCT 42.5 42.0  PLT 248  --     Coag's No results for input(s): APTT, INR in the last 168 hours.  Sepsis Markers  Recent Labs Lab 10/22/15 0953  LATICACIDVEN 2.99*  ABG  Recent Labs Lab 10/22/15 0935  PHART 7.371  PCO2ART 38.6  PO2ART 94.3    Liver Enzymes  Recent Labs Lab 10/22/15 0844  AST 37  ALT 33  ALKPHOS 98  BILITOT 0.8  ALBUMIN 4.0    Cardiac Enzymes No results for input(s): TROPONINI, PROBNP in the last 168 hours.  Glucose  Recent Labs Lab 10/22/15 0848  GLUCAP 151*    Imaging Ct Head Wo Contrast  10/22/2015  CLINICAL DATA:  The bottle high overdose. EXAM: CT HEAD WITHOUT CONTRAST TECHNIQUE: Contiguous axial images were obtained from the base of the skull through the vertex without intravenous contrast. COMPARISON:  None. FINDINGS: There is no  evidence of mass effect, midline shift or extra-axial fluid collections. There is no evidence of a space-occupying lesion or intracranial hemorrhage. There is no evidence of a cortical-based area of acute infarction. The ventricles and sulci are appropriate for the patient's age. The basal cisterns are patent. Visualized portions of the orbits are unremarkable. There is air-fluid level in the left maxillary sinus. There is mucosal thickening in the maxillary sinuses and ethmoid sinuses. The osseous structures are unremarkable. IMPRESSION: 1. No acute intracranial pathology. 2. Sinus disease as described above. Electronically Signed   By: Kathreen Devoid   On: 10/22/2015 10:52   Dg Chest Portable 1 View  10/22/2015  CLINICAL DATA:  Status post intubation.  Asthma. EXAM: PORTABLE CHEST 1 VIEW COMPARISON:  None. FINDINGS: Endotracheal tube with the tip 3 cm above the carina. Nasogastric tube coursing below the diaphragm. Bandlike area of airspace disease at the right lung base consistent with atelectasis. There is no pleural effusion or pneumothorax. The heart and mediastinal contours are unremarkable. The osseous structures are unremarkable. IMPRESSION: 1. Endotracheal tube with the tip 3 cm above the carina. 2. Nasogastric tube coursing below the diaphragm. 3. Bandlike area of airspace disease at the right lung base consistent with atelectasis. Electronically Signed   By: Kathreen Devoid   On: 10/22/2015 09:26     STUDIES:  5/29 CT head > no acute intracranial process  CULTURES: None  ANTIBIOTICS: None  SIGNIFICANT EVENTS: 529 admitted  LINES/TUBES: May 29 endotracheal tube  DISCUSSION: 45 year old female with a past medical history significant for bipolar disorder admitted on 10/22/2015 in the setting of likely alcohol overdose with Klonopin overdose and potentially Lamictal. Actual medications taken is uncertain. Currently she is on the ventilator with mild hypotension and no evidence of end organ  damage. Mental status cannot be assessed because rocuronium was administered for endotracheal intubation. QTc interval is mildly prolonged.  ASSESSMENT / PLAN:  NEUROLOGIC A:   Acute encephalopathy secondary to alcohol and Klonopin overdose Lamictal overdose Intentional overdose, suicide attempt Bipolar disorder Alcohol use P:   RASS goal: 0 Keep in contact with poison control PAD protocol after patient wakes up, intermittent dosing only Thiamine and folate daily Low threshold to use Precedex of severe agitation on ventilator Will need suicide precautions after extubation  PULMONARY A: Acute respiratory failure secondary to inability to protect airway P:   Continue full ventilator support Ventilation associated pneumonia prevention protocol Daily chest x-ray and wakeup assessment  CARDIOVASCULAR A:  Mildly prolonged QT interval P:  Check magnesium Telemetry monitoring Serial EKG  RENAL A:   No acute issues P:   Monitor BMET and UOP Replace electrolytes as needed   GASTROINTESTINAL A:   No acute issues Charcoal administered by ED P:   Nothing by mouth NG tube Famotidine for stress ulcer  prophylaxis  HEMATOLOGIC A:   No acute issues P:  Monitor for bleeding  INFECTIOUS A:   No acute issues P:   Monitor for fever  ENDOCRINE A:   No acute issues   P:   Monitor glucose   FAMILY  - Updates: ex-sister in law updated bedside  - Inter-disciplinary family meet or Palliative Care meeting due by:  day 7   My cc time 40 minutes  Roselie Awkward, MD [place] [facility] Pager: [phone] Cell: [phone] After 3pm or if no response, call [phone]   10/22/2015, 11:49 AM

## 2015-10-22 NOTE — ED Notes (Signed)
Per EMS patient texted sister this morning and asked her to come and take care of her dogs.  [employer]iff[employer] dept to house-patient had "snoring respirations" per EMS.  Patient intubated on arrival to ED.

## 2015-10-22 NOTE — Progress Notes (Signed)
Phenylephrine bag changed to quadruple strength.  Patient's RN Stanton Kidney) informed of change for bag concentration.  Dia Sitter, PharmD, BCPS 10/22/2015 3:48 PM

## 2015-10-22 NOTE — ED Provider Notes (Signed)
CSN: [id number]     Arrival date & time 10/22/15  0825 History   First MD Initiated Contact with Patient 10/22/15 [phone]     Chief Complaint  Patient presents with  . Ingestion  . Suicidal     (Consider location/radiation/quality/duration/timing/severity/associated sxs/prior Treatment) Patient is a 46 y.o. female presenting with Ingested Medication. The history is provided by the patient.  Ingestion This is a recurrent problem. The current episode started 1 to 2 hours ago. The problem occurs constantly. The problem has not changed since onset.Nothing aggravates the symptoms. Nothing relieves the symptoms. She has tried nothing for the symptoms. The treatment provided no relief.   45 yo F With a chief complaint of the overdose. The patient texed her ex-sister-in-law to have someone come and take care of her dogs. The patient was then contacted by the sister-in-law on the police were called. She has a history of attempted suicide in the past by pill overdose. She was found by EMS unresponsive with pill bottle scattered throughout her room. She had an empty bottle of Klonopin on her lap. Also in the room was Benadryl Wellbutrin.   Level V caveat unresponsive.   Past Medical History  Diagnosis Date  . Headache(784.0)   . Asthma   . Infection     UTI  . Fibromyalgia   . Cervical cancer ([place])   . Anxiety    Past Surgical History  Procedure Laterality Date  . Abdominal hysterectomy    . Cesarean section    . Dilation and curettage of uterus    . Fracture surgery      shattered knee cap  . Knee arthroscopy     Family History  Problem Relation Age of Onset  . Anxiety disorder Mother   . Hypertension Father   . Alcohol abuse Father   . Heart disease Paternal Uncle   . Cancer Maternal Grandmother     breast  . Heart disease Maternal Grandmother   . Diabetes Paternal Grandmother   . Heart disease Paternal Grandmother    Social History  Substance Use Topics  . Smoking status:  Former Research scientist (life sciences)  . Smokeless tobacco: Current User     Comment: e cig - 3 mg/ 4years  . Alcohol Use: No   OB History    Gravida Para Term Preterm AB TAB SAB Ectopic Multiple Living   3 1 1  2  2   1      Review of Systems    Allergies  Review of patient's allergies indicates no known allergies.  Home Medications   Prior to Admission medications   Medication Sig Start Date End Date Taking? Authorizing Provider  buPROPion (WELLBUTRIN XL) 150 MG 24 hr tablet Take 150 mg by mouth daily.   Yes Historical Provider, MD  clonazePAM (KLONOPIN) 0.5 MG tablet Take 0.5 mg by mouth daily as needed for anxiety.   Yes Historical Provider, MD  fluvoxaMINE (LUVOX) 50 MG tablet Take 50 mg by mouth at bedtime.   Yes Historical Provider, MD  ibuprofen (ADVIL,MOTRIN) 200 MG tablet Take 400-800 mg by mouth every 6 (six) hours as needed for pain.   Yes Historical Provider, MD  Naproxen-Esomeprazole (VIMOVO) 500-20 MG TBEC Take by mouth.   Yes Historical Provider, MD   BP 84/52 mmHg  Pulse 80  Temp(Src) 96.3 F (35.7 C)  Resp 14  Ht 5' 4"  (1.626 m)  Wt 182 lb 15.7 oz (83 kg)  BMI 31.39 kg/m2  SpO2 100%  LMP  Physical  Exam  Constitutional: She appears well-developed and well-nourished. No distress.  HENT:  Head: Normocephalic and atraumatic.  Eyes: EOM are normal. Pupils are equal, round, and reactive to light.  78m and sluggish bilat   Neck: Normal range of motion. Neck supple.  Cardiovascular: Normal rate and regular rhythm.  Exam reveals no gallop and no friction rub.   No murmur heard. Pulmonary/Chest: Effort normal. She has no wheezes. She has no rales.  Abdominal: Soft. She exhibits no distension. There is no tenderness. There is no rebound and no guarding.  Musculoskeletal: She exhibits no edema or tenderness.  Neurological: GCS eye subscore is 1. GCS verbal subscore is 1. GCS motor subscore is 1.  No gag  Skin: Skin is warm and dry. She is not diaphoretic.  Psychiatric: She has a  normal mood and affect. Her behavior is normal.  Nursing note and vitals reviewed.   ED Course  .Intubation Date/Time: 10/22/2015 9:40 AM Performed by: FTyrone NineDAN Authorized by: FDeno EtienneConsent: The procedure was performed in an emergent situation. Risks and benefits: risks, benefits and alternatives were discussed Time out: Immediately prior to procedure a "time out" was called to verify the correct patient, procedure, equipment, support staff and site/side marked as required. Indications: airway protection Intubation method: direct Patient status: unconscious Preoxygenation: nonrebreather mask Laryngoscope size: Mac 4 Tube size: 7.5 mm Tube type: cuffed Number of attempts: 1 Cricoid pressure: no Cords visualized: no Post-procedure assessment: chest rise and CO2 detector Breath sounds: equal and absent over the epigastrium Cuff inflated: yes ETT to lip: 24 cm Tube secured with: ETT holder Chest x-ray interpreted by me and radiologist. Chest x-ray findings: endotracheal tube in appropriate position Patient tolerance: Patient tolerated the procedure well with no immediate complications   (including critical care time) Labs Review Labs Reviewed  AMMONIA - Abnormal; Notable for the following:    Ammonia 38 (*)    All other components within normal limits  COMPREHENSIVE METABOLIC PANEL - Abnormal; Notable for the following:    Glucose, Bld 142 (*)    Calcium 8.3 (*)    All other components within normal limits  CBC WITH DIFFERENTIAL/PLATELET - Abnormal; Notable for the following:    Lymphs Abs 4.9 (*)    All other components within normal limits  ETHANOL - Abnormal; Notable for the following:    Alcohol, Ethyl (B) 271 (*)    All other components within normal limits  URINALYSIS, ROUTINE W REFLEX [facility]OSCOPIC [facility] - Abnormal; Notable for the following:    Leukocytes, UA MODERATE (*)    All other components within normal limits  URINE RAPID DRUG SCREEN, HOSP  PERFORMED - Abnormal; Notable for the following:    Benzodiazepines POSITIVE (*)    All other components within normal limits  BLOOD GAS, ARTERIAL - Abnormal; Notable for the following:    Acid-base deficit 2.7 (*)    All other components within normal limits  ACETAMINOPHEN LEVEL - Abnormal; Notable for the following:    Acetaminophen (Tylenol), Serum <10 (*)    All other components within normal limits  URINE MICROSCOPIC-ADD ON - Abnormal; Notable for the following:    Squamous Epithelial / LPF 0-5 (*)    Bacteria, UA FEW (*)    All other components within normal limits  I-STAT CHEM 8, ED - Abnormal; Notable for the following:    Creatinine, Ser 1.20 (*)    Glucose, Bld 136 (*)    Calcium, Ion 1.00 (*)    All  other components within normal limits  I-STAT CG4 LACTIC ACID, ED - Abnormal; Notable for the following:    Lactic Acid, Venous 2.99 (*)    All other components within normal limits  CBG MONITORING, ED - Abnormal; Notable for the following:    Glucose-Capillary 151 (*)    All other components within normal limits  MRSA PCR SCREENING  URINE CULTURE  SALICYLATE LEVEL  MAGNESIUM  PHOSPHORUS  COMPREHENSIVE METABOLIC PANEL    Imaging Review Ct Head Wo Contrast  10/22/2015  CLINICAL DATA:  The bottle high overdose. EXAM: CT HEAD WITHOUT CONTRAST TECHNIQUE: Contiguous axial images were obtained from the base of the skull through the vertex without intravenous contrast. COMPARISON:  None. FINDINGS: There is no evidence of mass effect, midline shift or extra-axial fluid collections. There is no evidence of a space-occupying lesion or intracranial hemorrhage. There is no evidence of a cortical-based area of acute infarction. The ventricles and sulci are appropriate for the patient's age. The basal cisterns are patent. Visualized portions of the orbits are unremarkable. There is air-fluid level in the left maxillary sinus. There is mucosal thickening in the maxillary sinuses and ethmoid  sinuses. The osseous structures are unremarkable. IMPRESSION: 1. No acute intracranial pathology. 2. Sinus disease as described above. Electronically Signed   By: Kathreen Devoid   On: 10/22/2015 10:52   Dg Chest Portable 1 View  10/22/2015  CLINICAL DATA:  Status post intubation.  Asthma. EXAM: PORTABLE CHEST 1 VIEW COMPARISON:  None. FINDINGS: Endotracheal tube with the tip 3 cm above the carina. Nasogastric tube coursing below the diaphragm. Bandlike area of airspace disease at the right lung base consistent with atelectasis. There is no pleural effusion or pneumothorax. The heart and mediastinal contours are unremarkable. The osseous structures are unremarkable. IMPRESSION: 1. Endotracheal tube with the tip 3 cm above the carina. 2. Nasogastric tube coursing below the diaphragm. 3. Bandlike area of airspace disease at the right lung base consistent with atelectasis. Electronically Signed   By: Kathreen Devoid   On: 10/22/2015 09:26   I have personally reviewed and evaluated these images and lab results as part of my medical decision-making.   EKG Interpretation   Date/Time:  Monday Oct 22 2015 08:48:05 EDT Ventricular Rate:  89 PR Interval:  159 QRS Duration: 96 QT Interval:  418 QTC Calculation: 509 R Axis:   79 Text Interpretation:  Sinus rhythm Borderline prolonged QT interval No old  tracing to compare Confirmed by Jerimiah Wolman MD, DANIEL [phone]) on 10/22/2015  8:54:24 AM Also confirmed by Tyrone Nine MD, DANIEL [phone]), editor [place] CT,  Leda Gauze [phone])  on 10/22/2015 10:52:15 AM      MDM   Final diagnoses:  Acute respiratory failure with hypoxemia [facility])    45 yo F with a likely drug overdose. Patient was intubated for respiratory protection. GCS is 3 on arrival with no gag. Was able to be intubated without medications. No sedation required while in the ED. Discussed with poison control recommended EKG monitoring for prolonged QT as well as delayed seizures. As I'm unsure of when the patient  actually ingested medications will give charcoal.  ICU admit.   CRITICAL CARE Performed by: Cecilio Asper   Total critical care time: 76 minutes  Critical care time was exclusive of separately billable procedures and treating other patients.  Critical care was necessary to treat or prevent imminent or life-threatening deterioration.  Critical care was time spent personally by me on the following activities: development of treatment  plan with patient and/or surrogate as well as nursing, discussions with consultants, evaluation of patient's response to treatment, examination of patient, obtaining history from patient or surrogate, ordering and performing treatments and interventions, ordering and review of laboratory studies, ordering and review of radiographic studies, pulse oximetry and re-evaluation of patient's condition.  The patients results and plan were reviewed and discussed.   Any x-rays performed were independently reviewed by myself.   Differential diagnosis were considered with the presenting HPI.  Medications  0.9 %  sodium chloride infusion (not administered)  enoxaparin (LOVENOX) injection 40 mg (not administered)  0.9 %  sodium chloride infusion (not administered)  sodium chloride 0.9 % bolus 1,000 mL (not administered)  acetaminophen (TYLENOL) tablet 650 mg (not administered)  ondansetron (ZOFRAN) injection 4 mg (not administered)  famotidine (PEPCID) IVPB 20 mg premix (not administered)  albuterol (PROVENTIL) (2.5 MG/3ML) 0.083% nebulizer solution 2.5 mg (not administered)  chlorhexidine gluconate (SAGE KIT) (PERIDEX) 0.12 % solution 15 mL (not administered)  antiseptic oral rinse solution (CORINZ) (not administered)  thiamine (B-1) injection 100 mg (not administered)  folic acid injection 1 mg (not administered)  phenylephrine (NEO-SYNEPHRINE) 10 mg in dextrose 5 % 250 mL (0.04 mg/mL) infusion (30 mcg/min Intravenous New Bag/Given 10/22/15 1325)   dexmedetomidine (PRECEDEX) 200 MCG/50ML (4 mcg/mL) infusion (not administered)  fentaNYL (SUBLIMAZE) injection 100 mcg (not administered)  fentaNYL (SUBLIMAZE) injection 100 mcg (not administered)  naloxone (NARCAN) injection 2 mg (2 mg Intravenous Given 10/22/15 0828)  activated charcoal-sorbitol (ACTIDOSE-SORBITOL) suspension 100 g (100 g Per Tube Given 10/22/15 0936)  magnesium sulfate IVPB 2 g 50 mL (0 g Intravenous Stopped 10/22/15 1108)  sodium chloride 0.9 % bolus 1,000 mL (0 mLs Intravenous Stopped 10/22/15 1121)  0.9 %  sodium chloride infusion ( Intravenous New Bag/Given 10/22/15 1130)  sodium chloride 0.9 % bolus 1,000 mL (1,000 mLs Intravenous New Bag/Given 10/22/15 1159)    Filed Vitals:   10/22/15 1212 10/22/15 1230 10/22/15 1232 10/22/15 1253  BP: 86/57   84/52  Pulse: 81   80  Temp:    96.3 F (35.7 C)  TempSrc:      Resp: 14   14  Height:  5' 4"  (1.626 m)    Weight:  182 lb 15.7 oz (83 kg)    SpO2: 100%  100% 100%    Final diagnoses:  Acute respiratory failure with hypoxemia (HCC)    Admission/ observation were discussed with the admitting physician, patient and/or family and they are comfortable with the plan.     Deno Etienne, DO 10/22/15 1427

## 2015-10-22 NOTE — ED Notes (Addendum)
Per sister patient has had a previous overdose attempt approximately 10 years ago.  Reports patient sees behavioral health specialists but has no insurance so care is not continuous.  Patient recently under stress due to new job.  Patient remains intubated without sedation.  Patient remains unresponsive.

## 2015-10-22 NOTE — ED Notes (Signed)
Patient placed on bair hugger per MD

## 2015-10-23 ENCOUNTER — Inpatient Hospital Stay (HOSPITAL_COMMUNITY): Payer: No Typology Code available for payment source

## 2015-10-23 DIAGNOSIS — T50902D Poisoning by unspecified drugs, medicaments and biological substances, intentional self-harm, subsequent encounter: Secondary | ICD-10-CM

## 2015-10-23 DIAGNOSIS — J9600 Acute respiratory failure, unspecified whether with hypoxia or hypercapnia: Secondary | ICD-10-CM

## 2015-10-23 LAB — BASIC METABOLIC PANEL
Anion gap: 5 (ref 5–15)
BUN: 7 mg/dL (ref 6–20)
CHLORIDE: 113 mmol/L — AB (ref 101–111)
CO2: 24 mmol/L (ref 22–32)
CREATININE: 0.95 mg/dL (ref 0.44–1.00)
Calcium: 7.2 mg/dL — ABNORMAL LOW (ref 8.9–10.3)
GFR calc Af Amer: >60 mL/min (ref >60)
GFR calc non Af Amer: >60 mL/min (ref >60)
Glucose, Bld: 100 mg/dL — ABNORMAL HIGH (ref 65–99)
Potassium: 4 mmol/L (ref 3.5–5.1)
Sodium: 142 mmol/L (ref 135–145)

## 2015-10-23 LAB — PROCALCITONIN

## 2015-10-23 LAB — CBC
HEMATOCRIT: 35.9 % — AB (ref 36.0–46.0)
HEMATOCRIT: 35.2 % — AB (ref 36.0–46.0)
HEMOGLOBIN: 12 g/dL (ref 12.0–15.0)
HEMOGLOBIN: 11.4 g/dL — AB (ref 12.0–15.0)
MCH: 30.9 pg (ref 26.0–34.0)
MCH: 30.1 pg (ref 26.0–34.0)
MCHC: 33.4 g/dL (ref 30.0–36.0)
MCHC: 32.4 g/dL (ref 30.0–36.0)
MCV: 92.5 fL (ref 78.0–100.0)
MCV: 92.9 fL (ref 78.0–100.0)
Platelets: 253 10*3/uL (ref 150–400)
Platelets: 224 10*3/uL (ref 150–400)
RBC: 3.88 MIL/uL (ref 3.87–5.11)
RBC: 3.79 MIL/uL — ABNORMAL LOW (ref 3.87–5.11)
RDW: 14.4 % (ref 11.5–15.5)
RDW: 14.4 % (ref 11.5–15.5)
WBC: 13.4 10*3/uL — ABNORMAL HIGH (ref 4.0–10.5)
WBC: 10.5 10*3/uL (ref 4.0–10.5)

## 2015-10-23 LAB — MAGNESIUM: Magnesium: 2.1 mg/dL (ref 1.7–2.4)

## 2015-10-23 LAB — PHOSPHORUS: PHOSPHORUS: 3.5 mg/dL (ref 2.5–4.6)

## 2015-10-23 MED ORDER — CETYLPYRIDINIUM CHLORIDE 0.05 % MT LIQD
7.0000 mL | Freq: Two times a day (BID) | OROMUCOSAL | Status: DC
  Administered 2015-10-25 – 2015-10-26 (×2): 7 mL via OROMUCOSAL

## 2015-10-23 MED ORDER — SODIUM CHLORIDE 0.9 % IV BOLUS (SEPSIS)
1000.0000 mL | Freq: Once | INTRAVENOUS | Status: AC
  Administered 2015-10-23: 1000 mL via INTRAVENOUS

## 2015-10-23 MED ORDER — PHENOL 1.4 % MT LIQD
1.0000 | OROMUCOSAL | Status: DC | PRN
  Administered 2015-10-23 – 2015-10-25 (×4): 1 via OROMUCOSAL
  Filled 2015-10-23: qty 177

## 2015-10-23 MED ORDER — MENTHOL 3 MG MT LOZG: 1.0000 | LOZENGE | OROMUCOSAL | Status: DC | PRN

## 2015-10-23 NOTE — Care Management Note (Signed)
Case Management Note  Patient Details  Name: CHERISSA BUSA MRN: [id number] Date of Birth: 08-26-1970  Subjective/Objective:   Overdose for various prescription medications requiring intubation for resp support.                 Action/Plan:Date:  Oct 23, 2015 Chart reviewed for concurrent status and case management needs. Will continue to follow patient for changes and needs: Expected discharge date: [id number] Velva Harman, BSN, [place], [place]   Expected Discharge Date:   (unknown)               Expected Discharge Plan:  [facility]  In-House Referral:  Clinical Social Work  Discharge planning Services  CM Consult  Post Acute Care Choice:  NA Choice offered to:  NA  DME Arranged:  N/A DME Agency:  NA  HH Arranged:  NA HH Agency:  NA  Status of Service:  In process, will continue to follow  Medicare Important Message Given:    Date Medicare IM Given:    Medicare IM give by:    Date Additional Medicare IM Given:    Additional Medicare Important Message give by:     If discussed at [place] of Stay Meetings, dates discussed:    Additional Comments:  Leeroy Cha, RN 10/23/2015, 8:36 AM

## 2015-10-23 NOTE — Procedures (Signed)
Extubation Procedure Note  Patient Details:   Name: Jacqueline Greene DOB: Nov 04, 1970 MRN: [id number]   Airway Documentation:  Airway 7.5 mm (Active)  Secured at (cm) 20 cm 10/23/2015  8:20 AM  Measured From Lips 10/23/2015  8:20 AM  [place] 10/23/2015  8:20 AM  Secured By [employer] 10/23/2015  8:20 AM  Tube Holder Repositioned Yes 10/23/2015  8:20 AM  Cuff Pressure (cm H2O) 27 cm H2O 10/23/2015  8:20 AM  Site Condition Dry 10/23/2015  3:12 AM    Evaluation  O2 sats: [id number] Complications: none Patient tolerated procedure well. Bilateral Breath Sounds: slightly coarse   Pt able to speak  Per CCM order, pt extubated, placed on 2L nasal cannula.  No complications, pt stable throughout the procedure.  Martha Clan 10/23/2015, 9:52 AM

## 2015-10-23 NOTE — Progress Notes (Signed)
PULMONARY / CRITICAL CARE MEDICINE   Name: Jacqueline Greene MRN: [id number] DOB: June 26, 1970    ADMISSION DATE:  10/22/2015 CONSULTATION DATE:  10/22/2015  REFERRING MD:  ER physician  CHIEF COMPLAINT:  Found down, drug overdose  BRIEF:  45 y/o F with PMH of ETOH abuse, bipolar disorder and prior suicide attempts admitted with overdose (suspected klonopin and lamictal).    SUBJECTIVE:  RN reports pt on PSV approx 45 minutes, tolerating well.  No acute events overnight.    VITAL SIGNS: BP 113/77 mmHg  Pulse 88  Temp(Src) 99.1 F (37.3 C) (Core (Comment))  Resp 12  Ht 5\' 4"  (1.626 m)  Wt 184 lb 1.4 oz (83.5 kg)  BMI 31.58 kg/m2  SpO2 100%  LMP   HEMODYNAMICS:    VENTILATOR SETTINGS: Vent Mode:  [-] SIMV/PC/PS FiO2 (%):  [30 %-100 %] 30 % Set Rate:  [14 bmp] 14 bmp Vt Set:  [440 mL-530 mL] 440 mL PEEP:  [5 cmH20] 5 cmH20 Pressure Support:  [5 cmH20] 5 cmH20 Plateau Pressure:  [16 cmH20-19 cmH20] 16 cmH20  INTAKE / OUTPUT: I/O last 3 completed shifts: In: 3164.7 [I.V.:2914.7; NG/GT:150; IV Piggyback:100] Out: 2800 [Urine:2250; Emesis/NG output:550]  PHYSICAL EXAMINATION: General:  Young adult female in NAD Neuro:  Awake, alert, follows commands, MAE HEENT:  NCAT PERRL, ETT in place Cardiovascular:  RRR, no mgr Lungs:  CTA, able to pull 1.1L + Abdomen:  BS+, soft, nontender Musculoskeletal:  Normal bulk and tone Skin:  No rash or skin breakdown  LABS:  BMET  Recent Labs Lab 10/22/15 0844 10/22/15 0855 10/22/15 1534 10/23/15 0315  NA 139 143 145 142  K 3.8 4.3 4.0 4.0  CL 106 106 118* 113*  CO2 24  --  21* 24  BUN 10 12 8 7   CREATININE 0.83 1.20* 0.77 0.95  GLUCOSE 142* 136* 120* 100*    Electrolytes  Recent Labs Lab 10/22/15 0844 10/22/15 1534 10/23/15 0315  CALCIUM 8.3* 7.0* 7.2*  MG  --  2.4 2.1  PHOS  --  2.6 3.5    CBC  Recent Labs Lab 10/22/15 0844 10/22/15 0855 10/23/15 0315  WBC 10.0  --  13.4*  HGB 14.9 14.3 12.0  HCT  42.5 42.0 35.9*  PLT 248  --  253    Coag's No results for input(s): APTT, INR in the last 168 hours.  Sepsis Markers  Recent Labs Lab 10/22/15 0953 10/22/15 1938 10/23/15 0315  LATICACIDVEN 2.99*  --   --   PROCALCITON  --  <0.10 <0.10    ABG  Recent Labs Lab 10/22/15 0935  PHART 7.371  PCO2ART 38.6  PO2ART 94.3    Liver Enzymes  Recent Labs Lab 10/22/15 0844 10/22/15 1534  AST 37 24  ALT 33 25  ALKPHOS 98 79  BILITOT 0.8 0.6  ALBUMIN 4.0 3.3*    Cardiac Enzymes No results for input(s): TROPONINI, PROBNP in the last 168 hours.  Glucose  Recent Labs Lab 10/22/15 0848  GLUCAP 151*    Imaging Ct Head Wo Contrast  10/22/2015  CLINICAL DATA:  The bottle high overdose. EXAM: CT HEAD WITHOUT CONTRAST TECHNIQUE: Contiguous axial images were obtained from the base of the skull through the vertex without intravenous contrast. COMPARISON:  None. FINDINGS: There is no evidence of mass effect, midline shift or extra-axial fluid collections. There is no evidence of a space-occupying lesion or intracranial hemorrhage. There is no evidence of a cortical-based area of acute infarction. The ventricles and sulci  are appropriate for the patient's age. The basal cisterns are patent. Visualized portions of the orbits are unremarkable. There is air-fluid level in the left maxillary sinus. There is mucosal thickening in the maxillary sinuses and ethmoid sinuses. The osseous structures are unremarkable. IMPRESSION: 1. No acute intracranial pathology. 2. Sinus disease as described above. Electronically Signed   By: Kathreen Devoid   On: 10/22/2015 10:52   Dg Chest Port 1 View  10/23/2015  CLINICAL DATA:  Asthma exacerbation. Ventilator dependent respiratory failure. Followup atelectasis. EXAM: PORTABLE CHEST 1 VIEW COMPARISON:  10/22/2015. FINDINGS: Endotracheal tube tip in satisfactory position projecting approximately 6 cm above the carina. Nasogastric tube courses below the diaphragm  into the stomach. Suboptimal inspiration with atelectasis in the lung bases, slightly increased since yesterday. Cardiac silhouette normal in size. Pulmonary vascularity normal. No visible pleural effusions. IMPRESSION: 1. Support apparatus satisfactory. 2. Worsening bibasilar atelectasis. Electronically Signed   By: Evangeline Dakin M.D.   On: 10/23/2015 07:12   Dg Chest Portable 1 View  10/22/2015  CLINICAL DATA:  Status post intubation.  Asthma. EXAM: PORTABLE CHEST 1 VIEW COMPARISON:  None. FINDINGS: Endotracheal tube with the tip 3 cm above the carina. Nasogastric tube coursing below the diaphragm. Bandlike area of airspace disease at the right lung base consistent with atelectasis. There is no pleural effusion or pneumothorax. The heart and mediastinal contours are unremarkable. The osseous structures are unremarkable. IMPRESSION: 1. Endotracheal tube with the tip 3 cm above the carina. 2. Nasogastric tube coursing below the diaphragm. 3. Bandlike area of airspace disease at the right lung base consistent with atelectasis. Electronically Signed   By: Kathreen Devoid   On: 10/22/2015 09:26     STUDIES:  5/29 CT head > no acute intracranial process  CULTURES: None  ANTIBIOTICS: None  SIGNIFICANT EVENTS: 5/29  Admitted with OD.  UDS positive for benzo's, ETOH 271 5/30  Extubated  LINES/TUBES: 5/29 ETT >>    DISCUSSION: 45 year old female with a past medical history significant for bipolar disorder admitted on 10/22/2015 in the setting of likely alcohol overdose with Klonopin overdose and potentially Lamictal. Actual medications taken is uncertain. Required ventilation with mild hypotension and no evidence of end organ damage. QTc interval mildly prolonged on admit.  Tolerated PSV 5/30  ASSESSMENT / PLAN:  NEUROLOGIC A:   Acute encephalopathy secondary to alcohol and Klonopin overdose  Lamictal overdose Intentional overdose, suicide attempt Bipolar disorder Alcohol use P:   RASS  goal: 0 Keep in contact with poison control Thiamine and folate daily Suicide precautions   Will need PSY evaluation post extubation  PULMONARY A: Acute respiratory failure secondary to inability to protect airway P:   PSV wean with plan for extubation am 5/30 Daily chest x-ray and wakeup assessment  CARDIOVASCULAR A:  Mildly prolonged QT interval P:  Telemetry monitoring Serial EKG, QTc 5/30 483  RENAL A:   No acute issues P:   Monitor BMET and UOP Replace electrolytes as needed   GASTROINTESTINAL A:   No acute issues Charcoal administered by ED P:   Nothing by mouth D/C NGT with extubation Famotidine for stress ulcer prophylaxis  HEMATOLOGIC A:   No acute issues P:  Monitor for bleeding Lovenox for DVT prophylaxis   INFECTIOUS A:   No acute issues P:   Monitor for fever  ENDOCRINE A:   No acute issues   P:   Monitor glucose   FAMILY  - Updates:  Family updated at bedside am 5/30  -  Inter-disciplinary family meet or Palliative Care meeting due by:  day Epps, NP-C [facility] Pgr: [phone] or if no answer [phone] 10/23/2015, 8:54 AM

## 2015-10-23 NOTE — Progress Notes (Signed)
Nutrition Follow-up  DOCUMENTATION CODES:   Obesity unspecified  INTERVENTION:  - Will monitor for diet advancement - RD will follow-up 5/31  NUTRITION DIAGNOSIS:   Inadequate oral intake related to inability to eat as evidenced by NPO status. -ongoing  GOAL:   Patient will meet greater than or equal to 90% of their needs -unmet  MONITOR:   Diet advancement, Weight trends, Labs, I & O's  ASSESSMENT:   45 year old female with a past medical history significant for alcohol abuse, bipolar disorder and a history of suicide attempt in the past who was brought to the [facility] emergency department on 10/22/2015. She had been struggling with insomnia and her family felt that her bipolar disorder was not very well controlled in the last several weeks. However, they had no suspicion of her considering suicide. They had been in contact with her as recent as late in the evening on 10/21/2015. Her sister-in-law received a text message at 7:03 AM on 10/22/2015 indicating that she had made a suicide attempt. She did not reveal which medication she took. There were empty bottles of Klonopin and Lamictal noted nearby. She also had prescriptions for buspirone, as omeprazole, naproxen, and fluoxetine.  5/30 Weight up 2 lbs since yesterday which is likely fluid related; will continue to monitor weight trends. Pt was extubated this AM with NGT removed at that time. Nutrition needs adjusted s/p extubation. Pt very drowsy at time of visit, no family/visitors present, sitter at bedside.   Physical assessment shows no muscle or fat wasting at this time. Medications reviewed; 1 mg folic acid/day, 123XX123 mg thiamine/day. IVF: receiving NS bolus (per rounds this AM). Labs reviewed; Cl: 113 mmol/L, Ca: 7.2 mg/dL.    5/29 - Spoke with ex-sister-in-law who is at bedside. - She reports that pt had a good appetite PTA and focused on eating healthy foods and homeopathy.  - She states that medications were recently  adjusted and that pt had been gaining weight related to this; she is unsure of pt's UBW or amount of weight gained.  - Patient is currently intubated on ventilator support with OGT in place; MV: 7.4 L/min; Temp (24hrs), Max:96.3 F (35.7 C); Propofol: none - Did not perform physical assessment at this time with bair hugger in place; will complete tomorrow if able.  - No recent weight hx available in the chart (last recorded weight was from 2014).   Diet Order:  Diet NPO time specified  Skin:  Reviewed, no issues  Last BM:  PTA  Height:   Ht Readings from Last 1 Encounters:  10/22/15 5\' 4"  (1.626 m)    Weight:   Wt Readings from Last 1 Encounters:  10/23/15 184 lb 1.4 oz (83.5 kg)    Ideal Body Weight:  54.54 kg (kg)  BMI:  Body mass index is 31.58 kg/(m^2).  Estimated Nutritional Needs:   Kcal:  1650-1850  Protein:  65-75 grams  Fluid:  1.6-1.8 L/day  EDUCATION NEEDS:   No education needs identified at this time     Jarome Matin, RD, LDN Inpatient Clinical Dietitian Pager # [phone] After hours/weekend pager # [phone]

## 2015-10-24 LAB — URINE CULTURE

## 2015-10-24 LAB — COMPREHENSIVE METABOLIC PANEL
ALBUMIN: 2.9 g/dL — AB (ref 3.5–5.0)
ALT: 20 U/L (ref 14–54)
ANION GAP: 7 (ref 5–15)
AST: 19 U/L (ref 15–41)
Alkaline Phosphatase: 84 U/L (ref 38–126)
BILIRUBIN TOTAL: 1.1 mg/dL (ref 0.3–1.2)
BUN: <5 mg/dL — ABNORMAL LOW (ref 6–20)
CALCIUM: 7.7 mg/dL — AB (ref 8.9–10.3)
CO2: 23 mmol/L (ref 22–32)
Chloride: 109 mmol/L (ref 101–111)
Creatinine, Ser: 0.82 mg/dL (ref 0.44–1.00)
GFR calc non Af Amer: >60 mL/min (ref >60)
GLUCOSE: 86 mg/dL (ref 65–99)
POTASSIUM: 3.3 mmol/L — AB (ref 3.5–5.1)
Sodium: 139 mmol/L (ref 135–145)
TOTAL PROTEIN: 5.6 g/dL — AB (ref 6.5–8.1)

## 2015-10-24 LAB — PROCALCITONIN: Procalcitonin: <0.1 ng/mL

## 2015-10-24 MED ORDER — POTASSIUM CHLORIDE 10 MEQ/100ML IV SOLN
10.0000 meq | INTRAVENOUS | Status: AC
  Administered 2015-10-24 (×4): 10 meq via INTRAVENOUS
  Filled 2015-10-24 (×4): qty 100

## 2015-10-24 MED ORDER — METHYLPREDNISOLONE SODIUM SUCC 125 MG IJ SOLR
80.0000 mg | Freq: Once | INTRAMUSCULAR | Status: AC
Start: 2015-10-24 — End: 2015-10-24
  Administered 2015-10-24: 80 mg via INTRAVENOUS
  Filled 2015-10-24: qty 2

## 2015-10-24 NOTE — Progress Notes (Signed)
During bath noticed pt had red, splotchy rash around neck, under arms, and abdomen.  Dr. Waldron Labs notified. One time dose of Solu-medrol 80mg  ordered.  Will continue to monitor.

## 2015-10-24 NOTE — Progress Notes (Signed)
Dr. Waldron Labs at bedside. Rash on pt's neck, underarms, and abdomen has cleared up.  Will continue to monitor.

## 2015-10-24 NOTE — Progress Notes (Signed)
PROGRESS NOTE                                                                                                                                                                                                             Patient Demographics:    Jacqueline Greene, is a 45 y.o. female, DOB - 09-30-1970, [id number]  Admit date - 10/22/2015   Admitting Physician Juanito Doom, MD  Outpatient Primary MD for the patient is No PCP Per Patient  LOS - 2  Chief Complaint  Patient presents with  . Ingestion  . Suicidal       Brief Narrative   45 year old female with a past medical history significant for bipolar disorder admitted on 10/22/2015 in the setting of likely alcohol overdose with Klonopin overdose and potentially Lamictal. Actual medications taken is uncertain. Required ventilation with mild hypotension and no evidence of end organ damage. QTc interval mildly prolonged on admit, And successfully extubated 5/30, She denies any suicidal intent, but felt at bedside reports she had previous suicide attempt.   Subjective:    Zamyiah Kiener today has, No headache, No chest pain, No abdominal pain .   Assessment  & Plan :    Active Problems:   Drug overdose, intentional ([place])   Acute respiratory failure (HCC)  Acute encephalopathy - Secondary to alcohol and Klonopin overdose, possible Lamictal overdose - Appears to be improving, but still sleepy, slow to respond  Acute respiratory failure secondary to inability to protect airway - She required intubation on admission, successfully extubated 5/30  Prolonged QTc - Repeat on 5/30 showing 483, check EKG in a.m.  Intentional overdose - Agent reports she took this medication as she did not sleep for 2 weeks, currently denies any suicidal ideation, father at bedside reports she did have suicide attempt in the past - Psychiatric consulted  Code Status : Full  Family Communication  : Father at  bedside  Disposition Plan  : Pending Psych evaluation  Consults  :  PCCM, PsycH  Procedures  : Elevated 5/29, extubated 5/30  DVT Prophylaxis  :  Lovenox  Lab Results  Component Value Date   PLT 224 10/23/2015    Antibiotics  :   Anti-infectives    None        Objective:   Filed Vitals:   10/24/15 0400 10/24/15  0500 10/24/15 0600 10/24/15 0707  BP: 131/69  147/78   Pulse: 76 79 81   Temp: 98.5 F (36.9 C)   99 F (37.2 C)  TempSrc:    Oral  Resp: 21 20 21    Height:      Weight: 85.5 kg (188 lb 7.9 oz)     SpO2: 91% 91% 98%     Wt Readings from Last 3 Encounters:  10/24/15 85.5 kg (188 lb 7.9 oz)  02/17/13 68.493 kg (151 lb)     Intake/Output Summary (Last 24 hours) at 10/24/15 0951 Last data filed at 10/24/15 0600  Gross per 24 hour  Intake   3400 ml  Output   1225 ml  Net   2175 ml     Physical Exam  Awake, slow to respond, but appropriate [place].AT,PERRAL Supple Neck,No JVD Symmetrical Chest wall movement, Good air movement bilaterally, CTAB RRR,No Gallops,Rubs or new Murmurs, No Parasternal Heave +ve B.Sounds, Abd Soft, No tenderness No Cyanosis, Clubbing or edema, No new Rash or bruise      Data Review:    CBC  Recent Labs Lab 10/22/15 0844 10/22/15 0855 10/23/15 0315 10/23/15 0959  WBC 10.0  --  13.4* 10.5  HGB 14.9 14.3 12.0 11.4*  HCT 42.5 42.0 35.9* 35.2*  PLT 248  --  253 224  MCV 86.9  --  92.5 92.9  MCH 30.5  --  30.9 30.1  MCHC 35.1  --  33.4 32.4  RDW 13.2  --  14.4 14.4  LYMPHSABS 4.9*  --   --   --   MONOABS 0.7  --   --   --   EOSABS 0.6  --   --   --   BASOSABS 0.1  --   --   --     Chemistries   Recent Labs Lab 10/22/15 0844 10/22/15 0855 10/22/15 1534 10/23/15 0315 10/24/15 0316  NA 139 143 145 142 139  K 3.8 4.3 4.0 4.0 3.3*  CL 106 106 118* 113* 109  CO2 24  --  21* 24 23  GLUCOSE 142* 136* 120* 100* 86  BUN 10 12 8 7  <5*  CREATININE 0.83 1.20* 0.77 0.95 0.82  CALCIUM 8.3*  --  7.0* 7.2* 7.7*    MG  --   --  2.4 2.1  --   AST 37  --  24  --  19  ALT 33  --  25  --  20  ALKPHOS 98  --  79  --  84  BILITOT 0.8  --  0.6  --  1.1   ------------------------------------------------------------------------------------------------------------------ No results for input(s): CHOL, HDL, LDLCALC, TRIG, CHOLHDL, LDLDIRECT in the last 72 hours.  No results found for: HGBA1C ------------------------------------------------------------------------------------------------------------------ No results for input(s): TSH, T4TOTAL, T3FREE, THYROIDAB in the last 72 hours.  Invalid input(s): FREET3 ------------------------------------------------------------------------------------------------------------------ No results for input(s): VITAMINB12, FOLATE, FERRITIN, TIBC, IRON, RETICCTPCT in the last 72 hours.  Coagulation profile No results for input(s): INR, PROTIME in the last 168 hours.  No results for input(s): DDIMER in the last 72 hours.  Cardiac Enzymes No results for input(s): CKMB, TROPONINI, MYOGLOBIN in the last 168 hours.  Invalid input(s): CK ------------------------------------------------------------------------------------------------------------------ No results found for: BNP  Inpatient Medications  Scheduled Meds: . antiseptic oral rinse  7 mL Mouth Rinse BID  . enoxaparin (LOVENOX) injection  40 mg Subcutaneous Q24H  . famotidine (PEPCID) IV  20 mg Intravenous Q12H  . folic acid  1 mg Intravenous Daily  .  potassium chloride  10 mEq Intravenous Q1 Hr x 4  . sodium chloride  1,000 mL Intravenous Once  . thiamine IV  100 mg Intravenous Daily   Continuous Infusions: . sodium chloride 150 mL/hr at 10/23/15 0921   PRN Meds:.sodium chloride, acetaminophen, albuterol, ondansetron (ZOFRAN) IV, phenol  Micro Results Recent Results (from the past 240 hour(s))  Urine culture     Status: Abnormal   Collection Time: 10/22/15  9:05 AM  Result Value Ref Range Status    Specimen Description URINE, CATHETERIZED  Final   Special Requests NONE  Final   Culture 20,000 COLONIES/mL KLEBSIELLA OXYTOCA (A)  Final   Report Status 10/24/2015 FINAL  Final   Organism ID, Bacteria KLEBSIELLA OXYTOCA (A)  Final      Susceptibility   Klebsiella oxytoca - MIC*    AMPICILLIN 8 RESISTANT Resistant     CEFAZOLIN <=4 SENSITIVE Sensitive     CEFTRIAXONE <=1 SENSITIVE Sensitive     CIPROFLOXACIN <=0.25 SENSITIVE Sensitive     GENTAMICIN <=1 SENSITIVE Sensitive     IMIPENEM <=0.25 SENSITIVE Sensitive     NITROFURANTOIN 32 SENSITIVE Sensitive     TRIMETH/SULFA <=20 SENSITIVE Sensitive     AMPICILLIN/SULBACTAM <=2 SENSITIVE Sensitive     PIP/TAZO <=4 SENSITIVE Sensitive     * 20,000 COLONIES/mL KLEBSIELLA OXYTOCA  MRSA PCR Screening     Status: None   Collection Time: 10/22/15 12:45 PM  Result Value Ref Range Status   MRSA by PCR NEGATIVE NEGATIVE Final    Comment:        The GeneXpert MRSA Assay (FDA approved for NASAL specimens only), is one component of a comprehensive MRSA colonization surveillance program. It is not intended to diagnose MRSA infection nor to guide or monitor treatment for MRSA infections.   Culture, respiratory (NON-Expectorated)     Status: None (Preliminary result)   Collection Time: 10/22/15  7:52 PM  Result Value Ref Range Status   Specimen Description TRACHEAL ASPIRATE  Final   Special Requests Normal  Final   Gram Stain   Final    ABUNDANT WBC PRESENT,BOTH PMN AND MONONUCLEAR FEW GRAM POSITIVE COCCI IN CLUSTERS IN CHAINS    Culture   Final    TOO YOUNG TO READ Performed at [facility]    Report Status PENDING  Incomplete    Radiology Reports Ct Head Wo Contrast  10/22/2015  CLINICAL DATA:  The bottle high overdose. EXAM: CT HEAD WITHOUT CONTRAST TECHNIQUE: Contiguous axial images were obtained from the base of the skull through the vertex without intravenous contrast. COMPARISON:  None. FINDINGS: There is no evidence of  mass effect, midline shift or extra-axial fluid collections. There is no evidence of a space-occupying lesion or intracranial hemorrhage. There is no evidence of a cortical-based area of acute infarction. The ventricles and sulci are appropriate for the patient's age. The basal cisterns are patent. Visualized portions of the orbits are unremarkable. There is air-fluid level in the left maxillary sinus. There is mucosal thickening in the maxillary sinuses and ethmoid sinuses. The osseous structures are unremarkable. IMPRESSION: 1. No acute intracranial pathology. 2. Sinus disease as described above. Electronically Signed   By: Kathreen Devoid   On: 10/22/2015 10:52   Dg Chest Port 1 View  10/23/2015  CLINICAL DATA:  Asthma exacerbation. Ventilator dependent respiratory failure. Followup atelectasis. EXAM: PORTABLE CHEST 1 VIEW COMPARISON:  10/22/2015. FINDINGS: Endotracheal tube tip in satisfactory position projecting approximately 6 cm above the carina. Nasogastric tube courses  below the diaphragm into the stomach. Suboptimal inspiration with atelectasis in the lung bases, slightly increased since yesterday. Cardiac silhouette normal in size. Pulmonary vascularity normal. No visible pleural effusions. IMPRESSION: 1. Support apparatus satisfactory. 2. Worsening bibasilar atelectasis. Electronically Signed   By: Evangeline Dakin M.D.   On: 10/23/2015 07:12   Dg Chest Portable 1 View  10/22/2015  CLINICAL DATA:  Status post intubation.  Asthma. EXAM: PORTABLE CHEST 1 VIEW COMPARISON:  None. FINDINGS: Endotracheal tube with the tip 3 cm above the carina. Nasogastric tube coursing below the diaphragm. Bandlike area of airspace disease at the right lung base consistent with atelectasis. There is no pleural effusion or pneumothorax. The heart and mediastinal contours are unremarkable. The osseous structures are unremarkable. IMPRESSION: 1. Endotracheal tube with the tip 3 cm above the carina. 2. Nasogastric tube  coursing below the diaphragm. 3. Bandlike area of airspace disease at the right lung base consistent with atelectasis. Electronically Signed   By: Kathreen Devoid   On: 10/22/2015 09:26    Time Spent in minutes  25 minutes   Waldron Labs, Anjelika Ausburn M.D on 10/24/2015 at 9:51 AM  Between 7am to 7pm - Pager - [phone]  After 7pm go to [email or web address] - password [facility]  [facility] -  Office  [phone]

## 2015-10-24 NOTE — Clinical Documentation Improvement (Signed)
Critical Care  Can the diagnosis of "mild hypotension, soft BP" be further specified? Please document response in next progress note. Thank you!    Hypotension secondary to drugs ingested - possible Klonopin/Lamictal  Hypotension secondary to sedation - Precedex  Hypotension secondary to some type of shock - Neosynephrine drip hung  Other Cause  Clinically Undetermined  Supporting Information:  BP's running 81/57 Map of 66, 84/52 Map of 60, 82/46 Map of 58  Please exercise your independent, professional judgment when responding. A specific answer is not anticipated or expected.  Thank You, Zoila Shutter RN, BSN, [place] [phone]; Cell: [phone]

## 2015-10-24 NOTE — Evaluation (Signed)
Clinical/Bedside Swallow Evaluation Patient Details  Name: Jacqueline Greene MRN: [id number] Date of Birth: [birth year]  Today's Date: 10/24/2015 Time: SLP Start Time (ACUTE ONLY): 9 SLP Stop Time (ACUTE ONLY): 1122 SLP Time Calculation (min) (ACUTE ONLY): 42 min  Past Medical History:  Past Medical History  Diagnosis Date  . Headache(784.0)   . Asthma   . Infection     UTI  . Fibromyalgia   . Cervical cancer ([place])   . Anxiety    Past Surgical History:  Past Surgical History  Procedure Laterality Date  . Abdominal hysterectomy    . Cesarean section    . Dilation and curettage of uterus    . Fracture surgery      shattered knee cap  . Knee arthroscopy     HPI:  45 yo female adm to [facility] 10/22/15 after drug overdose with respiratory failure requiring intubation 5/29-5/30/17.  PMH + for bipolar d/o, ETOH use, Klonopin use and is now deconditioned.  Pt CXR showed worsening ATX 5/30.     Assessment / Plan / Recommendation Clinical Impression  Pt presents with chronic throat clearing and odynophagia at baseline- ? Edema due to intubation.  Suspect aspiration of thin water via cup characterized by minimally delayed cough post-swallow.  Decreasing bolus size to tsp resulted in improved tolerance.  Pt denies residuals in pharynx but again endorses odynophagia.  Consumption of cold icecream and ice mitigated discomfort.   Pt with chronic throat clearing before, during and after intake therefore do not suspect aspiraton/penetration related.  She consumed po at slow rate and mostly self fed = maximizing automaticity of consumption.  Pt reported applesauce was "too acidic" therefore recommend avoid acidic, spicy items.    Recommend full liquids with strict compliance to precautions.  Anticipate pt will be ready for dietary advancement rapidly as she medically progresses.  Using teach back, educated pt to findings/recommendations.  Will follow for readiness for dietary advancement.         Aspiration Risk  Mild aspiration risk    Diet Recommendation Other (Comment);Thin liquid (full liquids)   Liquid Administration via: Spoon Medication Administration:  (? liquid form or with icecream/pudding) Supervision: Full supervision/cueing for compensatory strategies;Patient able to self feed Compensations: Slow rate;Small sips/bites Postural Changes: Seated upright at 90 degrees;Remain upright for at least 30 minutes after po intake    Other  Recommendations Recommended Consults:  (? ENT indicated if voice remains dysphonic) Oral Care Recommendations: Oral care BID   Follow up Recommendations       Frequency and Duration min 2x/week  1 week       Prognosis Prognosis for Safe Diet Advancement: Good      Swallow Study   General Date of Onset: 10/24/15 HPI: 46 yo female adm to [facility] 10/22/15 after drug overdose with respiratory failure requiring intubation 5/29-5/30/17.  PMH + for bipolar d/o, ETOH use, Klonopin use and is now deconditioned.  Pt CXR showed worsening ATX 5/30.   Type of Study: Bedside Swallow Evaluation Diet Prior to this Study: NPO Temperature Spikes Noted: Yes Respiratory Status: Nasal cannula History of Recent Intubation: Yes Length of Intubations (days): 2 days Date extubated: 10/23/15 Behavior/Cognition: Alert;Cooperative;Pleasant mood Oral Cavity Assessment: Within Functional Limits Oral Cavity - Dentition: Adequate natural dentition Vision: Functional for self-feeding Self-Feeding Abilities: Able to feed self Patient Positioning: Upright in bed Baseline Vocal Quality: Hoarse Volitional Cough: Weak Volitional Swallow: Able to elicit    Oral/Motor/Sensory Function Overall Oral Motor/Sensory Function: Generalized oral weakness (generalized  weakness)   Ice Chips Ice chips: Impaired Presentation: Spoon Oral Phase Impairments: Reduced labial seal;Reduced lingual movement/coordination Oral Phase Functional Implications: Prolonged oral  transit Pharyngeal Phase Impairments: Throat Clearing - Immediate;Throat Clearing - Delayed   Thin Liquid Thin Liquid: Impaired Presentation: Cup;Self Fed;Spoon Oral Phase Functional Implications: Prolonged oral transit Pharyngeal  Phase Impairments: Cough - Delayed;Multiple swallows    Nectar Thick Nectar Thick Liquid: Not tested   Honey Thick Honey Thick Liquid: Not tested   Puree Puree: Impaired Presentation: Spoon Oral Phase Impairments: Reduced lingual movement/coordination;Poor awareness of bolus Oral Phase Functional Implications: Prolonged oral transit Pharyngeal Phase Impairments: Throat Clearing - Immediate;Throat Clearing - Delayed   Solid   GO   Solid: Impaired Presentation: Self Fed Oral Phase Impairments: Reduced lingual movement/coordination;Impaired mastication Oral Phase Functional Implications: Prolonged oral transit Pharyngeal Phase Impairments: Throat Clearing - Delayed       Luanna Salk, [facility] SLP [phone]

## 2015-10-24 NOTE — Consult Note (Signed)
[facility] Face-to-Face Psychiatry Consult   Reason for Consult:  Bipolar depression and s/p suicide attempt Referring Physician:  Dr. Waldron Labs Patient Identification: Jacqueline Greene MRN:  [id number] Principal Diagnosis: Drug overdose, intentional [facility]) Diagnosis:   Patient Active Problem List   Diagnosis Date Noted  . Bipolar disorder with severe depression ([place]) [F31.4] 10/24/2015  . Acute respiratory failure ([place]) [J96.00]   . Drug overdose, intentional ([place]) [T50.902A] 10/22/2015    Total Time spent with patient: 1 hour  Subjective:   Jacqueline Greene is a 45 y.o. female patient admitted with suicide attempt with intentional drug overdose.  HPI:  Jacqueline Greene is a 45 year old female seen and chart reviewed for psych consultation and evaluation of intentional drug overdose, with intention to sleep after being insomnia for three days. Patient mother is at bed side and seems to be supportive to her. She has denied suicidide or homicide ideation, intention or plans. Patient sent a text to her friend sister in law - [name] asking to take care of her dog while she is sleeping vs suicide attempt. She hoped to sleep about 24 hours with five tablets of Klonopin 0.5 mg x 5. She has been suffering with bipolar disorder and OCD and has out patient psychiatrist Dr. [name] at [facility] psychiatric associates.  Patient with a past medical history significant for alcohol abuse, bipolar disorder and a history of suicide attempt in the past who was brought to the [facility] emergency department on 10/22/2015. She had been struggling with insomnia and her family felt that her bipolar disorder was not very well controlled in the last several weeks. However, they had no suspicion of her considering suicide. They had been in contact with her as recent as late in the evening on 10/21/2015. Her sister-in-law received a text message at 7:03 AM on 10/22/2015 indicating that she had made a suicide attempt. She  did not reveal which medication she took. There were empty bottles of Klonopin and Lamictal noted nearby. She also had prescriptions for buspirone, as omeprazole, naproxen, and fluoxetine.  Past Psychiatric History: she was admitted to [facility] about ten - twelve years ago for suicide attempt after broke up with her husband.   Risk to Self: Is patient at risk for suicide?: Yes Risk to Others:   Prior Inpatient Therapy:   Prior Outpatient Therapy:    Past Medical History:  Past Medical History  Diagnosis Date  . Headache(784.0)   . Asthma   . Infection     UTI  . Fibromyalgia   . Cervical cancer ([place])   . Anxiety     Past Surgical History  Procedure Laterality Date  . Abdominal hysterectomy    . Cesarean section    . Dilation and curettage of uterus    . Fracture surgery      shattered knee cap  . Knee arthroscopy     Family History:  Family History  Problem Relation Age of Onset  . Anxiety disorder Mother   . Hypertension Father   . Alcohol abuse Father   . Heart disease Paternal Uncle   . Cancer Maternal Grandmother     breast  . Heart disease Maternal Grandmother   . Diabetes Paternal Grandmother   . Heart disease Paternal Grandmother    Family Psychiatric  History: Maternal aunt with bipolar disorder. Social History:  History  Alcohol Use No     History  Drug Use No    Social History   Social History  .  Marital Status: Divorced    Spouse Name: N/A  . Number of Children: N/A  . Years of Education: N/A   Social History Main Topics  . Smoking status: Former Research scientist (life sciences)  . Smokeless tobacco: Current User     Comment: e cig - 3 mg/ 4years  . Alcohol Use: No  . Drug Use: No  . Sexual Activity: Yes   Other Topics Concern  . None   Social History Narrative   Additional Social History:    Allergies:  No Known Allergies  Labs:  Results for orders placed or performed during the hospital encounter of 10/22/15 (from the past 48 hour(s))  MRSA PCR Screening      Status: None   Collection Time: 10/22/15 12:45 PM  Result Value Ref Range   MRSA by PCR NEGATIVE NEGATIVE    Comment:        The GeneXpert MRSA Assay (FDA approved for NASAL specimens only), is one component of a comprehensive MRSA colonization surveillance program. It is not intended to diagnose MRSA infection nor to guide or monitor treatment for MRSA infections.   Magnesium     Status: None   Collection Time: 10/22/15  3:34 PM  Result Value Ref Range   Magnesium 2.4 1.7 - 2.4 mg/dL  Phosphorus     Status: None   Collection Time: 10/22/15  3:34 PM  Result Value Ref Range   Phosphorus 2.6 2.5 - 4.6 mg/dL  Comprehensive metabolic panel     Status: Abnormal   Collection Time: 10/22/15  3:34 PM  Result Value Ref Range   Sodium 145 135 - 145 mmol/L   Potassium 4.0 3.5 - 5.1 mmol/L   Chloride 118 (H) 101 - 111 mmol/L   CO2 21 (L) 22 - 32 mmol/L   Glucose, Bld 120 (H) 65 - 99 mg/dL   BUN 8 6 - 20 mg/dL   Creatinine, Ser 0.77 0.44 - 1.00 mg/dL   Calcium 7.0 (L) 8.9 - 10.3 mg/dL   Total Protein 5.9 (L) 6.5 - 8.1 g/dL   Albumin 3.3 (L) 3.5 - 5.0 g/dL   AST 24 15 - 41 U/L   ALT 25 14 - 54 U/L   Alkaline Phosphatase 79 38 - 126 U/L   Total Bilirubin 0.6 0.3 - 1.2 mg/dL   GFR calc non Af Amer >60 >60 mL/min   GFR calc Af Amer >60 >60 mL/min    Comment: (NOTE) The eGFR has been calculated using the CKD EPI equation. This calculation has not been validated in all clinical situations. eGFR's persistently <60 mL/min signify possible Chronic Kidney Disease.    Anion gap 6 5 - 15  Procalcitonin - Baseline     Status: None   Collection Time: 10/22/15  7:38 PM  Result Value Ref Range   Procalcitonin <0.10 ng/mL    Comment:        Interpretation: PCT (Procalcitonin) <= 0.5 ng/mL: Systemic infection (sepsis) is not likely. Local bacterial infection is possible. (NOTE)         ICU PCT Algorithm               Non ICU PCT Algorithm    ----------------------------      ------------------------------         PCT < 0.25 ng/mL                 PCT < 0.1 ng/mL     Stopping of antibiotics  Stopping of antibiotics       strongly encouraged.               strongly encouraged.    ----------------------------     ------------------------------       PCT level decrease by               PCT < 0.25 ng/mL       >= 80% from peak PCT       OR PCT 0.25 - 0.5 ng/mL          Stopping of antibiotics                                             encouraged.     Stopping of antibiotics           encouraged.    ----------------------------     ------------------------------       PCT level decrease by              PCT >= 0.25 ng/mL       < 80% from peak PCT        AND PCT >= 0.5 ng/mL            Continuin g antibiotics                                              encouraged.       Continuing antibiotics            encouraged.    ----------------------------     ------------------------------     PCT level increase compared          PCT > 0.5 ng/mL         with peak PCT AND          PCT >= 0.5 ng/mL             Escalation of antibiotics                                          strongly encouraged.      Escalation of antibiotics        strongly encouraged.   Culture, respiratory (NON-Expectorated)     Status: None (Preliminary result)   Collection Time: 10/22/15  7:52 PM  Result Value Ref Range   Specimen Description TRACHEAL ASPIRATE    Special Requests Normal    Gram Stain      ABUNDANT WBC PRESENT,BOTH PMN AND MONONUCLEAR FEW GRAM POSITIVE COCCI IN CLUSTERS IN CHAINS    Culture      TOO YOUNG TO READ Performed at [facility]    Report Status PENDING   CBC     Status: Abnormal   Collection Time: 10/23/15  3:15 AM  Result Value Ref Range   WBC 13.4 (H) 4.0 - 10.5 K/uL   RBC 3.88 3.87 - 5.11 MIL/uL   Hemoglobin 12.0 12.0 - 15.0 g/dL   HCT 35.9 (L) 36.0 - 46.0 %   MCV 92.5 78.0 - 100.0 fL   MCH 30.9 26.0 - 34.0 pg   MCHC 33.4 30.0 - 36.0  g/dL  RDW 14.4 11.5 - 15.5 %   Platelets 253 150 - 400 K/uL  Basic metabolic panel     Status: Abnormal   Collection Time: 10/23/15  3:15 AM  Result Value Ref Range   Sodium 142 135 - 145 mmol/L   Potassium 4.0 3.5 - 5.1 mmol/L   Chloride 113 (H) 101 - 111 mmol/L   CO2 24 22 - 32 mmol/L   Glucose, Bld 100 (H) 65 - 99 mg/dL   BUN 7 6 - 20 mg/dL   Creatinine, Ser 0.95 0.44 - 1.00 mg/dL   Calcium 7.2 (L) 8.9 - 10.3 mg/dL   GFR calc non Af Amer >60 >60 mL/min   GFR calc Af Amer >60 >60 mL/min    Comment: (NOTE) The eGFR has been calculated using the CKD EPI equation. This calculation has not been validated in all clinical situations. eGFR's persistently <60 mL/min signify possible Chronic Kidney Disease.    Anion gap 5 5 - 15  Magnesium     Status: None   Collection Time: 10/23/15  3:15 AM  Result Value Ref Range   Magnesium 2.1 1.7 - 2.4 mg/dL  Phosphorus     Status: None   Collection Time: 10/23/15  3:15 AM  Result Value Ref Range   Phosphorus 3.5 2.5 - 4.6 mg/dL  Procalcitonin     Status: None   Collection Time: 10/23/15  3:15 AM  Result Value Ref Range   Procalcitonin <0.10 ng/mL    Comment:        Interpretation: PCT (Procalcitonin) <= 0.5 ng/mL: Systemic infection (sepsis) is not likely. Local bacterial infection is possible. (NOTE)         ICU PCT Algorithm               Non ICU PCT Algorithm    ----------------------------     ------------------------------         PCT < 0.25 ng/mL                 PCT < 0.1 ng/mL     Stopping of antibiotics            Stopping of antibiotics       strongly encouraged.               strongly encouraged.    ----------------------------     ------------------------------       PCT level decrease by               PCT < 0.25 ng/mL       >= 80% from peak PCT       OR PCT 0.25 - 0.5 ng/mL          Stopping of antibiotics                                             encouraged.     Stopping of antibiotics           encouraged.     ----------------------------     ------------------------------       PCT level decrease by              PCT >= 0.25 ng/mL       < 80% from peak PCT        AND PCT >= 0.5 ng/mL  Continuin g antibiotics                                              encouraged.       Continuing antibiotics            encouraged.    ----------------------------     ------------------------------     PCT level increase compared          PCT > 0.5 ng/mL         with peak PCT AND          PCT >= 0.5 ng/mL             Escalation of antibiotics                                          strongly encouraged.      Escalation of antibiotics        strongly encouraged.   CBC     Status: Abnormal   Collection Time: 10/23/15  9:59 AM  Result Value Ref Range   WBC 10.5 4.0 - 10.5 K/uL   RBC 3.79 (L) 3.87 - 5.11 MIL/uL   Hemoglobin 11.4 (L) 12.0 - 15.0 g/dL   HCT 35.2 (L) 36.0 - 46.0 %   MCV 92.9 78.0 - 100.0 fL   MCH 30.1 26.0 - 34.0 pg   MCHC 32.4 30.0 - 36.0 g/dL   RDW 14.4 11.5 - 15.5 %   Platelets 224 150 - 400 K/uL  Procalcitonin     Status: None   Collection Time: 10/24/15  3:16 AM  Result Value Ref Range   Procalcitonin <0.10 ng/mL    Comment:        Interpretation: PCT (Procalcitonin) <= 0.5 ng/mL: Systemic infection (sepsis) is not likely. Local bacterial infection is possible. (NOTE)         ICU PCT Algorithm               Non ICU PCT Algorithm    ----------------------------     ------------------------------         PCT < 0.25 ng/mL                 PCT < 0.1 ng/mL     Stopping of antibiotics            Stopping of antibiotics       strongly encouraged.               strongly encouraged.    ----------------------------     ------------------------------       PCT level decrease by               PCT < 0.25 ng/mL       >= 80% from peak PCT       OR PCT 0.25 - 0.5 ng/mL          Stopping of antibiotics                                             encouraged.     Stopping of  antibiotics  encouraged.    ----------------------------     ------------------------------       PCT level decrease by              PCT >= 0.25 ng/mL       < 80% from peak PCT        AND PCT >= 0.5 ng/mL            Continuin g antibiotics                                              encouraged.       Continuing antibiotics            encouraged.    ----------------------------     ------------------------------     PCT level increase compared          PCT > 0.5 ng/mL         with peak PCT AND          PCT >= 0.5 ng/mL             Escalation of antibiotics                                          strongly encouraged.      Escalation of antibiotics        strongly encouraged.   Comprehensive metabolic panel     Status: Abnormal   Collection Time: 10/24/15  3:16 AM  Result Value Ref Range   Sodium 139 135 - 145 mmol/L   Potassium 3.3 (L) 3.5 - 5.1 mmol/L    Comment: RESULT REPEATED AND VERIFIED DELTA CHECK NOTED    Chloride 109 101 - 111 mmol/L   CO2 23 22 - 32 mmol/L   Glucose, Bld 86 65 - 99 mg/dL   BUN <5 (L) 6 - 20 mg/dL   Creatinine, Ser 0.82 0.44 - 1.00 mg/dL   Calcium 7.7 (L) 8.9 - 10.3 mg/dL   Total Protein 5.6 (L) 6.5 - 8.1 g/dL   Albumin 2.9 (L) 3.5 - 5.0 g/dL   AST 19 15 - 41 U/L   ALT 20 14 - 54 U/L   Alkaline Phosphatase 84 38 - 126 U/L   Total Bilirubin 1.1 0.3 - 1.2 mg/dL   GFR calc non Af Amer >60 >60 mL/min   GFR calc Af Amer >60 >60 mL/min    Comment: (NOTE) The eGFR has been calculated using the CKD EPI equation. This calculation has not been validated in all clinical situations. eGFR's persistently <60 mL/min signify possible Chronic Kidney Disease.    Anion gap 7 5 - 15    Current Facility-Administered Medications  Medication Dose Route Frequency Provider Last Rate Last Dose  . 0.9 %  sodium chloride infusion  250 mL Intravenous PRN Juanito Doom, MD 10 mL/hr at 10/22/15 1810 250 mL at 10/22/15 1810  . 0.9 %  sodium chloride infusion    Intravenous Continuous Juanito Doom, MD 150 mL/hr at 10/23/15 3704    . acetaminophen (TYLENOL) tablet 650 mg  650 mg Oral Q4H PRN Juanito Doom, MD   650 mg at 10/22/15 2153  . albuterol (PROVENTIL) (2.5 MG/3ML) 0.083% nebulizer solution 2.5 mg  2.5 mg Nebulization Q2H PRN Ronie Spies  McQuaid, MD      . antiseptic oral rinse (CPC / CETYLPYRIDINIUM CHLORIDE 0.05%) solution 7 mL  7 mL Mouth Rinse BID Juanito Doom, MD   7 mL at 10/23/15 2345  . enoxaparin (LOVENOX) injection 40 mg  40 mg Subcutaneous Q24H Juanito Doom, MD   40 mg at 10/23/15 1421  . famotidine (PEPCID) IVPB 20 mg premix  20 mg Intravenous Q12H Juanito Doom, MD   20 mg at 10/24/15 1004  . folic acid injection 1 mg  1 mg Intravenous Daily Juanito Doom, MD   1 mg at 10/24/15 1004  . ondansetron (ZOFRAN) injection 4 mg  4 mg Intravenous Q6H PRN Juanito Doom, MD      . phenol (CHLORASEPTIC) mouth spray 1 spray  1 spray Mouth/Throat PRN Vilinda Boehringer, MD   1 spray at 10/24/15 0827  . potassium chloride 10 mEq in 100 mL IVPB  10 mEq Intravenous Q1 Hr x 4 Dawood S Elgergawy, MD      . sodium chloride 0.9 % bolus 1,000 mL  1,000 mL Intravenous Once Juanito Doom, MD   1,000 mL at 10/22/15 1600  . thiamine (B-1) injection 100 mg  100 mg Intravenous Daily Juanito Doom, MD   100 mg at 10/24/15 1002    Musculoskeletal: Strength & Muscle Tone: decreased Gait & Station: unable to stand Patient leans: N/A  Psychiatric Specialty Exam: Physical Exam as per H&P  ROS depression, anxiety, OCD and insomnia. No Fever-chills, No Headache, No changes with Vision or hearing, reports vertigo No problems swallowing food or Liquids, No Chest pain, Cough or Shortness of Breath, No Abdominal pain, No Nausea or Vommitting, Bowel movements are regular, No Blood in stool or Urine, No dysuria, No new skin rashes or bruises, No new joints pains-aches,  No new weakness, tingling, numbness in any extremity, No  recent weight gain or loss, No polyuria, polydypsia or polyphagia,   A full 10 point Review of Systems was done, except as stated above, all other Review of Systems were negative.  Blood pressure 147/78, pulse 81, temperature 99 F (37.2 C), temperature source Oral, resp. rate 21, height 5' 4"  (1.626 m), weight 85.5 kg (188 lb 7.9 oz), SpO2 98 %.Body mass index is 32.34 kg/(m^2).  General Appearance: Guarded  Eye Contact:  Fair  Speech:  Clear and Coherent and Slow  Volume:  Decreased  Mood:  Anxious and Depressed  Affect:  Constricted and Depressed  Thought Process:  Goal Directed  Orientation:  Full (Time, Place, and Person)  Thought Content:  Obsessions and Rumination  Suicidal Thoughts:  Yes.  with intent/plan  Homicidal Thoughts:  No  Memory:  Immediate;   Fair Recent;   Fair  Judgement:  Impaired  Insight:  Fair  Psychomotor Activity:  Decreased  Concentration:  Concentration: Fair  Recall:  [employer] of Knowledge:  Fair  Language:  Good  Akathisia:  Negative  Handed:  Right  AIMS (if indicated):     Assets:  Communication Skills Desire for Improvement Financial Resources/Insurance Housing Leisure Time Resilience Social Support Transportation  ADL's:  Impaired  Cognition:  WNL  Sleep:        Treatment Plan Summary: Daily contact with patient to assess and evaluate symptoms and progress in treatment and Medication Designer, jewellery as she made intentional drug overdose and questionable suicide attempt vs three days of insomnia. Need collateral from her sister in law/friend karen.  Recommend no psychotropic medication until medically cleared  Appreciate psychiatric consultation and follow up as clinically required Please contact [phone] or [phone] if needs further assistance  Disposition: Recommend psychiatric Inpatient admission when medically cleared. Supportive therapy provided about ongoing stressors.  Ambrose Finland,  MD 10/24/2015 10:35 AM

## 2015-10-24 NOTE — Progress Notes (Signed)
NUTRITION NOTE  Full follow-up note 5/30 at 1202. Pt remains NPO at this time with no SLP consult placed; will monitor for ability for diet advancement and provide interventions as warranted related to the same. Pt admitted for OD and on suicide precautions s/p extubation. MD note from this AM states pt with acute encephalopathy that is improving by pt remains sleepy and slow to respond.   RD will continue to follow per protocol.     Jarome Matin, RD, LDN Inpatient Clinical Dietitian Pager # [phone] After hours/weekend pager # [phone]

## 2015-10-25 DIAGNOSIS — T424X4A Poisoning by benzodiazepines, undetermined, initial encounter: Secondary | ICD-10-CM

## 2015-10-25 DIAGNOSIS — R45851 Suicidal ideations: Secondary | ICD-10-CM

## 2015-10-25 DIAGNOSIS — F314 Bipolar disorder, current episode depressed, severe, without psychotic features: Principal | ICD-10-CM

## 2015-10-25 LAB — BASIC METABOLIC PANEL
Anion gap: 7 (ref 5–15)
BUN: 5 mg/dL — AB (ref 6–20)
CALCIUM: 8.4 mg/dL — AB (ref 8.9–10.3)
CO2: 23 mmol/L (ref 22–32)
CREATININE: 0.75 mg/dL (ref 0.44–1.00)
Chloride: 111 mmol/L (ref 101–111)
GFR calc Af Amer: >60 mL/min (ref >60)
GLUCOSE: 138 mg/dL — AB (ref 65–99)
Potassium: 3.4 mmol/L — ABNORMAL LOW (ref 3.5–5.1)
Sodium: 141 mmol/L (ref 135–145)

## 2015-10-25 LAB — CBC
HCT: 36.6 % (ref 36.0–46.0)
Hemoglobin: 12.4 g/dL (ref 12.0–15.0)
MCH: 29.9 pg (ref 26.0–34.0)
MCHC: 33.9 g/dL (ref 30.0–36.0)
MCV: 88.2 fL (ref 78.0–100.0)
PLATELETS: 217 10*3/uL (ref 150–400)
RBC: 4.15 MIL/uL (ref 3.87–5.11)
RDW: 13.3 % (ref 11.5–15.5)
WBC: 6.5 10*3/uL (ref 4.0–10.5)

## 2015-10-25 LAB — CULTURE, RESPIRATORY W GRAM STAIN: Special Requests: NORMAL

## 2015-10-25 LAB — CULTURE, RESPIRATORY

## 2015-10-25 MED ORDER — FAMOTIDINE 20 MG PO TABS
20.0000 mg | ORAL_TABLET | Freq: Two times a day (BID) | ORAL | Status: DC
  Administered 2015-10-25 – 2015-10-26 (×2): 20 mg via ORAL
  Filled 2015-10-25 (×2): qty 1

## 2015-10-25 MED ORDER — POTASSIUM CHLORIDE CRYS ER 20 MEQ PO TBCR
40.0000 meq | EXTENDED_RELEASE_TABLET | Freq: Once | ORAL | Status: AC
  Administered 2015-10-25: 40 meq via ORAL
  Filled 2015-10-25: qty 2

## 2015-10-25 MED ORDER — VITAMIN B-1 100 MG PO TABS
100.0000 mg | ORAL_TABLET | Freq: Every day | ORAL | Status: DC
  Administered 2015-10-26: 100 mg via ORAL
  Filled 2015-10-25: qty 1

## 2015-10-25 MED ORDER — FLUVOXAMINE MALEATE 50 MG PO TABS
50.0000 mg | ORAL_TABLET | Freq: Every day | ORAL | Status: DC
  Administered 2015-10-25: 50 mg via ORAL
  Filled 2015-10-25 (×3): qty 1

## 2015-10-25 MED ORDER — FOLIC ACID 1 MG PO TABS
1.0000 mg | ORAL_TABLET | Freq: Every day | ORAL | Status: DC
  Administered 2015-10-26: 1 mg via ORAL
  Filled 2015-10-25: qty 1

## 2015-10-25 NOTE — Consult Note (Signed)
[facility] Face-to-Face Psychiatry Consult follow-up  Reason for Consult:  Bipolar depression and s/p suicide attempt Referring Physician:  Dr. Waldron Labs Patient Identification: Jacqueline Greene MRN:  [id number] Principal Diagnosis: Drug overdose, intentional [facility]) Diagnosis:   Patient Active Problem List   Diagnosis Date Noted  . Bipolar disorder with severe depression ([place]) [F31.4] 10/24/2015  . Acute respiratory failure ([place]) [J96.00]   . Drug overdose, intentional ([place]) [T50.902A] 10/22/2015    Total Time spent with patient: 30 minutes  Subjective:   Jacqueline Greene is a 45 y.o. female patient admitted with suicide attempt with intentional drug overdose.  HPI:  Jacqueline Greene is a 45 year old female seen and chart reviewed for psych consultation and evaluation of intentional drug overdose, with intention to sleep after being insomnia for three days. Patient mother is at bed side and seems to be supportive to her. She has denied suicidide or homicide ideation, intention or plans. Patient sent a text to her friend sister in law - [name] asking to take care of her dog while she is sleeping vs suicide attempt. She hoped to sleep about 24 hours with five tablets of Klonopin 0.5 mg x 5. She has been suffering with bipolar disorder and OCD and has out patient psychiatrist Dr. [name] at [facility] psychiatric associates.  Patient with a past medical history significant for alcohol abuse, bipolar disorder and a history of suicide attempt in the past who was brought to the [facility] emergency department on 10/22/2015. She had been struggling with insomnia and her family felt that her bipolar disorder was not very well controlled in the last several weeks. However, they had no suspicion of her considering suicide. They had been in contact with her as recent as late in the evening on 10/21/2015. Her sister-in-law received a text message at 7:03 AM on 10/22/2015 indicating that she had made a suicide  attempt. She did not reveal which medication she took. There were empty bottles of Klonopin and Lamictal noted nearby. She also had prescriptions for buspirone, as omeprazole, naproxen, and fluoxetine.  Past Psychiatric History: she was admitted to [facility] about ten - twelve years ago for suicide attempt after broke up with her husband.   Interval history: Patient seen for this psychiatric consultation follow-up today. Patient mother and her friend [name] were at bedside. Patient stated she has been under multiple stresses related to broke up with her boyfriend about a year ago, part-time job, financial difficulties, being supported financially by mother who has been telling her she cannot support her any more, stressed about all the problems, reportedly feeling alone and then drank alcohol and in took intentional overdose to end her life. Patient reportedly called suicide Hotline and sent a text message to her friend to take care of her dogs. Patient stated she wanted to go and follow up with mood treatment center where she can get psychiatric medication management and counseling at the same time. Patient cannot contract for safety and meet criteria for inpatient psychiatric hospitalization.  Risk to Self: Is patient at risk for suicide?: Yes Risk to Others:   Prior Inpatient Therapy:   Prior Outpatient Therapy:    Past Medical History:  Past Medical History  Diagnosis Date  . Headache(784.0)   . Asthma   . Infection     UTI  . Fibromyalgia   . Cervical cancer ([place])   . Anxiety     Past Surgical History  Procedure Laterality Date  . Abdominal hysterectomy    .  Cesarean section    . Dilation and curettage of uterus    . Fracture surgery      shattered knee cap  . Knee arthroscopy     Family History:  Family History  Problem Relation Age of Onset  . Anxiety disorder Mother   . Hypertension Father   . Alcohol abuse Father   . Heart disease Paternal Uncle   . Cancer Maternal  Grandmother     breast  . Heart disease Maternal Grandmother   . Diabetes Paternal Grandmother   . Heart disease Paternal Grandmother    Family Psychiatric  History: Maternal aunt with bipolar disorder. Social History:  History  Alcohol Use No     History  Drug Use No    Social History   Social History  . Marital Status: Divorced    Spouse Name: N/A  . Number of Children: N/A  . Years of Education: N/A   Social History Main Topics  . Smoking status: Former Research scientist (life sciences)  . Smokeless tobacco: Current User     Comment: e cig - 3 mg/ 4years  . Alcohol Use: No  . Drug Use: No  . Sexual Activity: Yes   Other Topics Concern  . None   Social History Narrative   Additional Social History:    Allergies:  No Known Allergies  Labs:  Results for orders placed or performed during the hospital encounter of 10/22/15 (from the past 48 hour(s))  Procalcitonin     Status: None   Collection Time: 10/24/15  3:16 AM  Result Value Ref Range   Procalcitonin <0.10 ng/mL    Comment:        Interpretation: PCT (Procalcitonin) <= 0.5 ng/mL: Systemic infection (sepsis) is not likely. Local bacterial infection is possible. (NOTE)         ICU PCT Algorithm               Non ICU PCT Algorithm    ----------------------------     ------------------------------         PCT < 0.25 ng/mL                 PCT < 0.1 ng/mL     Stopping of antibiotics            Stopping of antibiotics       strongly encouraged.               strongly encouraged.    ----------------------------     ------------------------------       PCT level decrease by               PCT < 0.25 ng/mL       >= 80% from peak PCT       OR PCT 0.25 - 0.5 ng/mL          Stopping of antibiotics                                             encouraged.     Stopping of antibiotics           encouraged.    ----------------------------     ------------------------------       PCT level decrease by              PCT >= 0.25 ng/mL       < 80%  from  peak PCT        AND PCT >= 0.5 ng/mL            Continuin g antibiotics                                              encouraged.       Continuing antibiotics            encouraged.    ----------------------------     ------------------------------     PCT level increase compared          PCT > 0.5 ng/mL         with peak PCT AND          PCT >= 0.5 ng/mL             Escalation of antibiotics                                          strongly encouraged.      Escalation of antibiotics        strongly encouraged.   Comprehensive metabolic panel     Status: Abnormal   Collection Time: 10/24/15  3:16 AM  Result Value Ref Range   Sodium 139 135 - 145 mmol/L   Potassium 3.3 (L) 3.5 - 5.1 mmol/L    Comment: RESULT REPEATED AND VERIFIED DELTA CHECK NOTED    Chloride 109 101 - 111 mmol/L   CO2 23 22 - 32 mmol/L   Glucose, Bld 86 65 - 99 mg/dL   BUN <5 (L) 6 - 20 mg/dL   Creatinine, Ser 0.82 0.44 - 1.00 mg/dL   Calcium 7.7 (L) 8.9 - 10.3 mg/dL   Total Protein 5.6 (L) 6.5 - 8.1 g/dL   Albumin 2.9 (L) 3.5 - 5.0 g/dL   AST 19 15 - 41 U/L   ALT 20 14 - 54 U/L   Alkaline Phosphatase 84 38 - 126 U/L   Total Bilirubin 1.1 0.3 - 1.2 mg/dL   GFR calc non Af Amer >60 >60 mL/min   GFR calc Af Amer >60 >60 mL/min    Comment: (NOTE) The eGFR has been calculated using the CKD EPI equation. This calculation has not been validated in all clinical situations. eGFR's persistently <60 mL/min signify possible Chronic Kidney Disease.    Anion gap 7 5 - 15  CBC     Status: None   Collection Time: 10/25/15  3:19 AM  Result Value Ref Range   WBC 6.5 4.0 - 10.5 K/uL   RBC 4.15 3.87 - 5.11 MIL/uL   Hemoglobin 12.4 12.0 - 15.0 g/dL   HCT 36.6 36.0 - 46.0 %   MCV 88.2 78.0 - 100.0 fL   MCH 29.9 26.0 - 34.0 pg   MCHC 33.9 30.0 - 36.0 g/dL   RDW 13.3 11.5 - 15.5 %   Platelets 217 150 - 400 K/uL  Basic metabolic panel     Status: Abnormal   Collection Time: 10/25/15  3:19 AM  Result Value Ref  Range   Sodium 141 135 - 145 mmol/L   Potassium 3.4 (L) 3.5 - 5.1 mmol/L   Chloride 111 101 - 111 mmol/L   CO2 23 22 - 32 mmol/L   Glucose, Bld 138 (H)  65 - 99 mg/dL   BUN 5 (L) 6 - 20 mg/dL   Creatinine, Ser 0.75 0.44 - 1.00 mg/dL   Calcium 8.4 (L) 8.9 - 10.3 mg/dL   GFR calc non Af Amer >60 >60 mL/min   GFR calc Af Amer >60 >60 mL/min    Comment: (NOTE) The eGFR has been calculated using the CKD EPI equation. This calculation has not been validated in all clinical situations. eGFR's persistently <60 mL/min signify possible Chronic Kidney Disease.    Anion gap 7 5 - 15    Current Facility-Administered Medications  Medication Dose Route Frequency Provider Last Rate Last Dose  . 0.9 %  sodium chloride infusion  250 mL Intravenous PRN Juanito Doom, MD 10 mL/hr at 10/22/15 1810 250 mL at 10/22/15 1810  . 0.9 %  sodium chloride infusion   Intravenous Continuous Juanito Doom, MD 150 mL/hr at 10/25/15 0954    . acetaminophen (TYLENOL) tablet 650 mg  650 mg Oral Q4H PRN Juanito Doom, MD   650 mg at 10/22/15 2153  . albuterol (PROVENTIL) (2.5 MG/3ML) 0.083% nebulizer solution 2.5 mg  2.5 mg Nebulization Q2H PRN Juanito Doom, MD      . antiseptic oral rinse (CPC / CETYLPYRIDINIUM CHLORIDE 0.05%) solution 7 mL  7 mL Mouth Rinse BID Juanito Doom, MD   7 mL at 10/25/15 0954  . enoxaparin (LOVENOX) injection 40 mg  40 mg Subcutaneous Q24H Juanito Doom, MD   40 mg at 10/24/15 1821  . famotidine (PEPCID) IVPB 20 mg premix  20 mg Intravenous Q12H Juanito Doom, MD   20 mg at 10/25/15 0913  . folic acid injection 1 mg  1 mg Intravenous Daily Juanito Doom, MD   1 mg at 10/25/15 0913  . ondansetron (ZOFRAN) injection 4 mg  4 mg Intravenous Q6H PRN Juanito Doom, MD      . phenol (CHLORASEPTIC) mouth spray 1 spray  1 spray Mouth/Throat PRN Vilinda Boehringer, MD   1 spray at 10/25/15 0422  . sodium chloride 0.9 % bolus 1,000 mL  1,000 mL Intravenous Once Juanito Doom, MD   1,000 mL at 10/22/15 1600  . thiamine (B-1) injection 100 mg  100 mg Intravenous Daily Juanito Doom, MD   100 mg at 10/25/15 0911    Musculoskeletal: Strength & Muscle Tone: decreased Gait & Station: unable to stand Patient leans: N/A  Psychiatric Specialty Exam: Physical Exam as per H&P  ROS:  Blood pressure 162/97, pulse 66, temperature 98.7 F (37.1 C), temperature source Oral, resp. rate 17, height 5' 4"  (1.626 m), weight 83.2 kg (183 lb 6.8 oz), SpO2 99 %.Body mass index is 31.47 kg/(m^2).  General Appearance: Guarded  Eye Contact:  Fair  Speech:  Clear and Coherent and Slow  Volume:  Decreased  Mood:  Anxious and Depressed  Affect:  Constricted and Depressed  Thought Process:  Goal Directed  Orientation:  Full (Time, Place, and Person)  Thought Content:  Obsessions and Rumination and has vocal tics   Suicidal Thoughts:  Yes.  with intent/plan  Homicidal Thoughts:  No  Memory:  Immediate;   Fair Recent;   Fair  Judgement:  Impaired  Insight:  Fair  Psychomotor Activity:  Decreased  Concentration:  Concentration: Fair  Recall:  [employer] of Knowledge:  Fair  Language:  Good  Akathisia:  Negative  Handed:  Right  AIMS (if indicated):     Assets:  Communication Skills Desire for Improvement Financial Resources/Insurance Housing Leisure Time Resilience Social Support Transportation  ADL's:  Impaired  Cognition:  WNL  Sleep:        Treatment Plan Summary: Patient has been suffering with bipolar disorder, depression, anxiety, OCD and alcohol abuse. Patient endorses intentional drug overdose to end her life while intoxicated and also made a call to suicide Hotline.   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  Patient mother and her friend [name] were at bedside, both of them endorses the findings and increase her to seek inpatient treatment. Patient meets criteria for acute psychiatric  hospitalization Recommend restart Luvox 50 mg at bedtime for anxiety and OCD  Appreciate psychiatric consultation and follow up as clinically required Please contact [phone] or [phone] if needs further assistance  Disposition: Recommend psychiatric Inpatient admission when medically cleared. Supportive therapy provided about ongoing stressors.  Ambrose Finland, MD 10/25/2015 10:31 AM

## 2015-10-25 NOTE — Progress Notes (Signed)
Patient stated to me that she took Klonopin (both 1mg  and 1/2mg  tabs), Luvox, Celexa, and Lamictal as part of her overdose. She stated that she was not sure of the exact amounts, but all bottles had recently been refilled and were full or nearly full. The patient also stated that she called a suicide hotline prior to taking the medication and they placed her on hold for "more than 30 minutes".  The patient stated that her doctor had taken her off of Wellbutrin about a week prior to her overdose because it was making her "manic" and started her on the Luvox.

## 2015-10-25 NOTE — Progress Notes (Signed)
Nutrition Follow-up  DOCUMENTATION CODES:   Obesity unspecified  INTERVENTION:  -RD to continue to monitor  NUTRITION DIAGNOSIS:   Inadequate oral intake related to inability to eat as evidenced by NPO status. -Resolving with diet advancement  GOAL:   Patient will meet greater than or equal to 90% of their needs -Progressing  MONITOR:   Diet advancement, Weight trends, Labs, I & O's  ASSESSMENT:   45 year old female with a past medical history significant for alcohol abuse, bipolar disorder and a history of suicide attempt in the past who was brought to the [facility] emergency department on 10/22/2015. She had been struggling with insomnia and her family felt that her bipolar disorder was not very well controlled in the last several weeks. However, they had no suspicion of her considering suicide. They had been in contact with her as recent as late in the evening on 10/21/2015. Her sister-in-law received a text message at 7:03 AM on 10/22/2015 indicating that she had made a suicide attempt. She did not reveal which medication she took. There were empty bottles of Klonopin and Lamictal noted nearby. She also had prescriptions for buspirone, as omeprazole, naproxen, and fluoxetine.  6/1 Jacqueline Greene is feeling a bit better. She had a full liquid tray during my visit with grits and pudding that she tolerated well.  States "the pudding is soothing." She also had ginger ale which she states "burned."  Complains of secretions that she can't fully clear "I wish I could spit them out," but otherwise has no acute complaints. No nausea/vomiting No issues chewing/swallowing Expect PO intake to meet 100% of needs as patient progresses.  Labs and medications reviewed: K 3.4 Thiamine 100mg  PO daily, Folic Acid 1mg  PO daily   Diet Order:  Diet regular Room service appropriate?: Yes; Fluid consistency:: Thin  Skin:  Reviewed, no issues  Last BM:  PTA  Height:   Ht Readings from  Last 1 Encounters:  10/22/15 5\' 4"  (1.626 m)    Weight:   Wt Readings from Last 1 Encounters:  10/25/15 183 lb 6.8 oz (83.2 kg)    Ideal Body Weight:  54.54 kg (kg)  BMI:  Body mass index is 31.47 kg/(m^2).  Estimated Nutritional Needs:   Kcal:  1650-1850  Protein:  65-75 grams  Fluid:  1.6-1.8 L/day  EDUCATION NEEDS:   No education needs identified at this time  Satira Anis. Rian Koon, MS, RD LDN Inpatient Clinical Dietitian Pager [phone]

## 2015-10-25 NOTE — Progress Notes (Addendum)
PROGRESS NOTE                                                                                                                                                                                                             Patient Demographics:    Jacqueline Greene, is a 45 y.o. female, DOB - November 30, 1970, [id number]  Admit date - 10/22/2015   Admitting Physician Juanito Doom, MD  Outpatient Primary MD for the patient is No PCP Per Patient  LOS - 3  Chief Complaint  Patient presents with  . Ingestion  . Suicidal       Brief Narrative   45 year old female with a past medical history significant for bipolar disorder admitted on 10/22/2015 in the setting of likely alcohol overdose with Klonopin overdose and potentially Lamictal. Actual medications taken is uncertain. Required ventilation with mild hypotension and no evidence of end organ damage. QTc interval mildly prolonged on admit, And successfully extubated 5/30, Initially she denies suicidal intent, but currently she reports she called suicide line before her attempt, and she took multiple medication including Klonopin, Luvox, Celexa and Lamictal as a part of her overdose.   Subjective:    Jacqueline Greene today has, No headache, No chest pain, No abdominal pain , She had rash yesterday which has been resolved.   Assessment  & Plan :    Principal Problem:   Drug overdose, intentional ([place]) Active Problems:   Acute respiratory failure (HCC)   Bipolar disorder with severe depression ([place])  Acute encephalopathy - Secondary to Intentional overdose , Patient  currently endorses she did take Klonopin, Lamictal, Luvox and Celexa  - Appears to be improving, more communicative awake and coherent today, will transfer to telemetry.  Acute respiratory failure secondary to inability to protect airway - She required intubation on admission, successfully extubated 5/30 - Resolved, back to baseline on room  air  Prolonged QTc - Repeat on 5/30 showing 483, repeat EKG  Intentional overdose - Continue with suicide precaution - Psychiatry consulted - Patient  currently endorses she did take Klonopin, Lamictal, Luvox and Celexa , and she called suicide line before her attempt.  Transient hypotension secondary to medication effect - Resolved, blood pressure stable, actually mildly elevated, will continue to monitor, will start on when necessary hydralazine.  Hypokalemia - Repleted  Code Status : Full  Family Communication  : Mother at bedside  Disposition Plan  : Pending Psych evaluation  Consults  :  PCCM, PsycH  Procedures  : Elevated 5/29, extubated 5/30  DVT Prophylaxis  :  Lovenox  Lab Results  Component Value Date   PLT 217 10/25/2015    Antibiotics  :   Anti-infectives    None        Objective:   Filed Vitals:   10/25/15 0400 10/25/15 0500 10/25/15 0600 10/25/15 0800  BP: 150/80  162/97   Pulse: 69 63 66   Temp:    98.7 F (37.1 C)  TempSrc:    Oral  Resp: 20 9 17    Height:      Weight:      SpO2: 97% 99% 99%     Wt Readings from Last 3 Encounters:  10/25/15 83.2 kg (183 lb 6.8 oz)  02/17/13 68.493 kg (151 lb)     Intake/Output Summary (Last 24 hours) at 10/25/15 1000 Last data filed at 10/25/15 0600  Gross per 24 hour  Intake   3510 ml  Output   1225 ml  Net   2285 ml     Physical Exam  Awake, Alert, appropriate. [place].AT,PERRAL Supple Neck,No JVD Symmetrical Chest wall movement, Good air movement bilaterally, CTAB RRR,No Gallops,Rubs or new Murmurs, No Parasternal Heave +ve B.Sounds, Abd Soft, No tenderness No Cyanosis, Clubbing or edema, No new Rash or bruise      Data Review:    CBC  Recent Labs Lab 10/22/15 0844 10/22/15 0855 10/23/15 0315 10/23/15 0959 10/25/15 0319  WBC 10.0  --  13.4* 10.5 6.5  HGB 14.9 14.3 12.0 11.4* 12.4  HCT 42.5 42.0 35.9* 35.2* 36.6  PLT 248  --  253 224 217  MCV 86.9  --  92.5 92.9 88.2  MCH  30.5  --  30.9 30.1 29.9  MCHC 35.1  --  33.4 32.4 33.9  RDW 13.2  --  14.4 14.4 13.3  LYMPHSABS 4.9*  --   --   --   --   MONOABS 0.7  --   --   --   --   EOSABS 0.6  --   --   --   --   BASOSABS 0.1  --   --   --   --     Chemistries   Recent Labs Lab 10/22/15 0844 10/22/15 0855 10/22/15 1534 10/23/15 0315 10/24/15 0316 10/25/15 0319  NA 139 143 145 142 139 141  K 3.8 4.3 4.0 4.0 3.3* 3.4*  CL 106 106 118* 113* 109 111  CO2 24  --  21* 24 23 23   GLUCOSE 142* 136* 120* 100* 86 138*  BUN 10 12 8 7  <5* 5*  CREATININE 0.83 1.20* 0.77 0.95 0.82 0.75  CALCIUM 8.3*  --  7.0* 7.2* 7.7* 8.4*  MG  --   --  2.4 2.1  --   --   AST 37  --  24  --  19  --   ALT 33  --  25  --  20  --   ALKPHOS 98  --  79  --  84  --   BILITOT 0.8  --  0.6  --  1.1  --    ------------------------------------------------------------------------------------------------------------------ No results for input(s): CHOL, HDL, LDLCALC, TRIG, CHOLHDL, LDLDIRECT in the last 72 hours.  No results found for: HGBA1C ------------------------------------------------------------------------------------------------------------------ No results for input(s): TSH, T4TOTAL, T3FREE, THYROIDAB in the last 72 hours.  Invalid input(s): FREET3 ------------------------------------------------------------------------------------------------------------------ No results for input(s): VITAMINB12, FOLATE, FERRITIN, TIBC, IRON, RETICCTPCT in the last 72 hours.  Coagulation profile No results for input(s): INR, PROTIME in the last 168 hours.  No results for input(s): DDIMER in the last 72 hours.  Cardiac Enzymes No results for input(s): CKMB, TROPONINI, MYOGLOBIN in the last 168 hours.  Invalid input(s): CK ------------------------------------------------------------------------------------------------------------------ No results found for: BNP  Inpatient Medications  Scheduled Meds: . antiseptic oral rinse  7 mL  Mouth Rinse BID  . enoxaparin (LOVENOX) injection  40 mg Subcutaneous Q24H  . famotidine (PEPCID) IV  20 mg Intravenous Q12H  . folic acid  1 mg Intravenous Daily  . sodium chloride  1,000 mL Intravenous Once  . thiamine IV  100 mg Intravenous Daily   Continuous Infusions: . sodium chloride 150 mL/hr at 10/25/15 0954   PRN Meds:.sodium chloride, acetaminophen, albuterol, ondansetron (ZOFRAN) IV, phenol  Micro Results Recent Results (from the past 240 hour(s))  Urine culture     Status: Abnormal   Collection Time: 10/22/15  9:05 AM  Result Value Ref Range Status   Specimen Description URINE, CATHETERIZED  Final   Special Requests NONE  Final   Culture 20,000 COLONIES/mL KLEBSIELLA OXYTOCA (A)  Final   Report Status 10/24/2015 FINAL  Final   Organism ID, Bacteria KLEBSIELLA OXYTOCA (A)  Final      Susceptibility   Klebsiella oxytoca - MIC*    AMPICILLIN 8 RESISTANT Resistant     CEFAZOLIN <=4 SENSITIVE Sensitive     CEFTRIAXONE <=1 SENSITIVE Sensitive     CIPROFLOXACIN <=0.25 SENSITIVE Sensitive     GENTAMICIN <=1 SENSITIVE Sensitive     IMIPENEM <=0.25 SENSITIVE Sensitive     NITROFURANTOIN 32 SENSITIVE Sensitive     TRIMETH/SULFA <=20 SENSITIVE Sensitive     AMPICILLIN/SULBACTAM <=2 SENSITIVE Sensitive     PIP/TAZO <=4 SENSITIVE Sensitive     * 20,000 COLONIES/mL KLEBSIELLA OXYTOCA  MRSA PCR Screening     Status: None   Collection Time: 10/22/15 12:45 PM  Result Value Ref Range Status   MRSA by PCR NEGATIVE NEGATIVE Final    Comment:        The GeneXpert MRSA Assay (FDA approved for NASAL specimens only), is one component of a comprehensive MRSA colonization surveillance program. It is not intended to diagnose MRSA infection nor to guide or monitor treatment for MRSA infections.   Culture, respiratory (NON-Expectorated)     Status: None (Preliminary result)   Collection Time: 10/22/15  7:52 PM  Result Value Ref Range Status   Specimen Description TRACHEAL ASPIRATE   Final   Special Requests Normal  Final   Gram Stain   Final    ABUNDANT WBC PRESENT,BOTH PMN AND MONONUCLEAR FEW GRAM POSITIVE COCCI IN CLUSTERS IN CHAINS Performed at [place]  Final   Report Status PENDING  Incomplete    Radiology Reports Ct Head Wo Contrast  10/22/2015  CLINICAL DATA:  The bottle high overdose. EXAM: CT HEAD WITHOUT CONTRAST TECHNIQUE: Contiguous axial images were obtained from the base of the skull through the vertex without intravenous contrast. COMPARISON:  None. FINDINGS: There is no evidence of mass effect, midline shift or extra-axial fluid collections. There is no evidence of a space-occupying lesion or intracranial hemorrhage. There is no evidence of a cortical-based area of acute infarction. The ventricles and sulci are appropriate for the patient's age. The basal cisterns are patent. Visualized portions of the orbits  are unremarkable. There is air-fluid level in the left maxillary sinus. There is mucosal thickening in the maxillary sinuses and ethmoid sinuses. The osseous structures are unremarkable. IMPRESSION: 1. No acute intracranial pathology. 2. Sinus disease as described above. Electronically Signed   By: Kathreen Devoid   On: 10/22/2015 10:52   Dg Chest Port 1 View  10/23/2015  CLINICAL DATA:  Asthma exacerbation. Ventilator dependent respiratory failure. Followup atelectasis. EXAM: PORTABLE CHEST 1 VIEW COMPARISON:  10/22/2015. FINDINGS: Endotracheal tube tip in satisfactory position projecting approximately 6 cm above the carina. Nasogastric tube courses below the diaphragm into the stomach. Suboptimal inspiration with atelectasis in the lung bases, slightly increased since yesterday. Cardiac silhouette normal in size. Pulmonary vascularity normal. No visible pleural effusions. IMPRESSION: 1. Support apparatus satisfactory. 2. Worsening bibasilar atelectasis. Electronically Signed   By: Evangeline Dakin M.D.    On: 10/23/2015 07:12   Dg Chest Portable 1 View  10/22/2015  CLINICAL DATA:  Status post intubation.  Asthma. EXAM: PORTABLE CHEST 1 VIEW COMPARISON:  None. FINDINGS: Endotracheal tube with the tip 3 cm above the carina. Nasogastric tube coursing below the diaphragm. Bandlike area of airspace disease at the right lung base consistent with atelectasis. There is no pleural effusion or pneumothorax. The heart and mediastinal contours are unremarkable. The osseous structures are unremarkable. IMPRESSION: 1. Endotracheal tube with the tip 3 cm above the carina. 2. Nasogastric tube coursing below the diaphragm. 3. Bandlike area of airspace disease at the right lung base consistent with atelectasis. Electronically Signed   By: Kathreen Devoid   On: 10/22/2015 09:26    Time Spent in minutes  25 minutes   ELGERGAWY, DAWOOD M.D on 10/25/2015 at 10:00 AM  Between 7am to 7pm - Pager - [phone]  After 7pm go to [email or web address] - password [facility]  [facility] -  Office  [phone]

## 2015-10-25 NOTE — Progress Notes (Signed)
Pharmacy IV to PO conversion  The patient is receiving Pepcid, Thiamine, and Folate by the intravenous route.  Based on criteria approved by the Pharmacy and [place], the medication is being converted to the equivalent oral dose form.   No active GI bleeding or impaired absorption  Not s/p esophagectomy  Documented ability to take oral medications for > 24 hr  Plan to continue treatment for at least 1 day  If you have any questions about this conversion, please contact the Pharmacy Department (ext [phone]).  Thank you.  Reuel Boom, PharmD Pager: [phone] 10/25/2015, 11:06 AM

## 2015-10-25 NOTE — Progress Notes (Signed)
Speech Language Pathology Treatment: Dysphagia  Patient Details Name: Jacqueline Greene MRN: [id number] DOB: [birth year] Today's Date: 10/25/2015 Time: [id number] SLP Time Calculation (min) (ACUTE ONLY): 21 min  Assessment / Plan / Recommendation Clinical Impression  Pt today continues with mild hoarseness but improvement compared to yesterday.  Throat clearing noted at baseline and with intake.  Pt also reports continued discomfort in throat - isolated to left side of neck.  NO overt indication of airway compromise nor dysphagia.  Suspect pt's throat clearing is due to pharyngeal agitation from intubation.  Recommend advance diet to regular/thin with strict aspiration precautions.  Educated pt to precautions and plans.  Will follow up x1 to assure tolerance and for pt/family education.     HPI HPI: 45 yo female adm to [facility] 10/22/15 after drug overdose with respiratory failure requiring intubation 5/29-5/30/17.  PMH + for bipolar d/o, ETOH use, Klonopin use and is now deconditioned.  Pt CXR showed worsening ATX 5/30.  Pt was started on full liquids yesterday, today seen to determine readiness for dietary advancement.       SLP Plan  Continue with current plan of care     Recommendations  Diet recommendations: Thin liquid;Regular Medication Administration: Whole meds with liquid Supervision: Patient able to self feed;Full supervision/cueing for compensatory strategies Compensations: Slow rate;Small sips/bites             Oral Care Recommendations: Oral care BID Plan: Continue with current plan of care     [place], [place], [place] [facility] SLP [phone]

## 2015-10-26 ENCOUNTER — Inpatient Hospital Stay
Admission: EM | Admit: 2015-10-26 | Discharge: 2015-10-30 | DRG: 885 | Disposition: A | Discharge status: Home / Self Care | Payer: No Typology Code available for payment source | Source: Intra-hospital | Attending: Psychiatry | Admitting: Psychiatry

## 2015-10-26 DIAGNOSIS — M797 Fibromyalgia: Secondary | ICD-10-CM | POA: Diagnosis present

## 2015-10-26 DIAGNOSIS — Z8541 Personal history of malignant neoplasm of cervix uteri: Secondary | ICD-10-CM

## 2015-10-26 DIAGNOSIS — Z915 Personal history of self-harm: Secondary | ICD-10-CM

## 2015-10-26 DIAGNOSIS — F17290 Nicotine dependence, other tobacco product, uncomplicated: Secondary | ICD-10-CM | POA: Diagnosis present

## 2015-10-26 DIAGNOSIS — T424X1A Poisoning by benzodiazepines, accidental (unintentional), initial encounter: Secondary | ICD-10-CM | POA: Diagnosis present

## 2015-10-26 DIAGNOSIS — T50902A Poisoning by unspecified drugs, medicaments and biological substances, intentional self-harm, initial encounter: Secondary | ICD-10-CM | POA: Diagnosis present

## 2015-10-26 DIAGNOSIS — Z811 Family history of alcohol abuse and dependence: Secondary | ICD-10-CM | POA: Diagnosis not present

## 2015-10-26 DIAGNOSIS — F339 Major depressive disorder, recurrent, unspecified: Secondary | ICD-10-CM | POA: Diagnosis present

## 2015-10-26 DIAGNOSIS — J45909 Unspecified asthma, uncomplicated: Secondary | ICD-10-CM | POA: Diagnosis present

## 2015-10-26 DIAGNOSIS — Z90710 Acquired absence of both cervix and uterus: Secondary | ICD-10-CM

## 2015-10-26 DIAGNOSIS — M1990 Unspecified osteoarthritis, unspecified site: Secondary | ICD-10-CM | POA: Diagnosis present

## 2015-10-26 DIAGNOSIS — Z818 Family history of other mental and behavioral disorders: Secondary | ICD-10-CM

## 2015-10-26 DIAGNOSIS — Z9889 Other specified postprocedural states: Secondary | ICD-10-CM | POA: Diagnosis not present

## 2015-10-26 DIAGNOSIS — Z79899 Other long term (current) drug therapy: Secondary | ICD-10-CM | POA: Diagnosis not present

## 2015-10-26 DIAGNOSIS — Y908 Blood alcohol level of 240 mg/100 ml or more: Secondary | ICD-10-CM | POA: Diagnosis present

## 2015-10-26 DIAGNOSIS — Z8249 Family history of ischemic heart disease and other diseases of the circulatory system: Secondary | ICD-10-CM | POA: Diagnosis not present

## 2015-10-26 DIAGNOSIS — F3181 Bipolar II disorder: Principal | ICD-10-CM | POA: Diagnosis present

## 2015-10-26 DIAGNOSIS — Z833 Family history of diabetes mellitus: Secondary | ICD-10-CM | POA: Diagnosis not present

## 2015-10-26 DIAGNOSIS — Z803 Family history of malignant neoplasm of breast: Secondary | ICD-10-CM

## 2015-10-26 LAB — BASIC METABOLIC PANEL
Anion gap: 6 (ref 5–15)
BUN: 6 mg/dL (ref 6–20)
CHLORIDE: 108 mmol/L (ref 101–111)
CO2: 27 mmol/L (ref 22–32)
Calcium: 8 mg/dL — ABNORMAL LOW (ref 8.9–10.3)
Creatinine, Ser: 0.78 mg/dL (ref 0.44–1.00)
GFR calc Af Amer: >60 mL/min (ref >60)
GFR calc non Af Amer: >60 mL/min (ref >60)
Glucose, Bld: 108 mg/dL — ABNORMAL HIGH (ref 65–99)
POTASSIUM: 3.6 mmol/L (ref 3.5–5.1)
SODIUM: 141 mmol/L (ref 135–145)

## 2015-10-26 LAB — CBC
HEMATOCRIT: 33.7 % — AB (ref 36.0–46.0)
HEMOGLOBIN: 11.5 g/dL — AB (ref 12.0–15.0)
MCH: 29.6 pg (ref 26.0–34.0)
MCHC: 34.1 g/dL (ref 30.0–36.0)
MCV: 86.9 fL (ref 78.0–100.0)
Platelets: 250 10*3/uL (ref 150–400)
RBC: 3.88 MIL/uL (ref 3.87–5.11)
RDW: 13.6 % (ref 11.5–15.5)
WBC: 8.1 10*3/uL (ref 4.0–10.5)

## 2015-10-26 MED ORDER — GUAIFENESIN ER 600 MG PO TB12
1200.0000 mg | ORAL_TABLET | Freq: Two times a day (BID) | ORAL | Status: AC
  Administered 2015-10-26 – 2015-10-28 (×5): 1200 mg via ORAL
  Filled 2015-10-26 (×6): qty 2

## 2015-10-26 MED ORDER — MENTHOL 3 MG MT LOZG
1.0000 | LOZENGE | OROMUCOSAL | Status: DC | PRN
  Filled 2015-10-26: qty 9

## 2015-10-26 MED ORDER — FLUVOXAMINE MALEATE 50 MG PO TABS
50.0000 mg | ORAL_TABLET | Freq: Every day | ORAL | Status: DC
  Administered 2015-10-26 – 2015-10-27 (×2): 50 mg via ORAL
  Filled 2015-10-26 (×3): qty 1

## 2015-10-26 MED ORDER — FOLIC ACID 1 MG PO TABS: 1.0000 mg | ORAL_TABLET | Freq: Every day | ORAL | Status: DC

## 2015-10-26 MED ORDER — FAMOTIDINE 20 MG PO TABS
20.0000 mg | ORAL_TABLET | Freq: Two times a day (BID) | ORAL | Status: DC
  Administered 2015-10-26 – 2015-10-30 (×8): 20 mg via ORAL
  Filled 2015-10-26 (×9): qty 1

## 2015-10-26 MED ORDER — MAGNESIUM HYDROXIDE 400 MG/5ML PO SUSP: 30.0000 mL | Freq: Every day | ORAL | Status: DC | PRN

## 2015-10-26 MED ORDER — GUAIFENESIN ER 600 MG PO TB12
1200.0000 mg | ORAL_TABLET | Freq: Two times a day (BID) | ORAL | Status: DC
  Administered 2015-10-26: 1200 mg via ORAL
  Filled 2015-10-26: qty 2

## 2015-10-26 MED ORDER — THIAMINE HCL 100 MG PO TABS: 100.0000 mg | ORAL_TABLET | Freq: Every day | ORAL | Status: DC

## 2015-10-26 MED ORDER — MENTHOL 3 MG MT LOZG
1.0000 | LOZENGE | OROMUCOSAL | Status: DC | PRN
  Administered 2015-10-26: 3 mg via ORAL
  Filled 2015-10-26: qty 9

## 2015-10-26 MED ORDER — PHENOL 1.4 % MT LIQD
1.0000 | OROMUCOSAL | Status: DC | PRN
  Administered 2015-10-27 – 2015-10-28 (×3): 1 via OROMUCOSAL
  Filled 2015-10-26: qty 177

## 2015-10-26 MED ORDER — FLUVOXAMINE MALEATE 50 MG PO TABS: 50.0000 mg | ORAL_TABLET | Freq: Every day | ORAL | Status: DC

## 2015-10-26 MED ORDER — TRAZODONE HCL 50 MG PO TABS
50.0000 mg | ORAL_TABLET | Freq: Every evening | ORAL | Status: DC | PRN
  Filled 2015-10-26: qty 1

## 2015-10-26 MED ORDER — GUAIFENESIN ER 600 MG PO TB12: 1200.0000 mg | ORAL_TABLET | Freq: Two times a day (BID) | ORAL | Status: DC

## 2015-10-26 MED ORDER — MENTHOL 3 MG MT LOZG: 1.0000 | LOZENGE | OROMUCOSAL | Status: DC | PRN

## 2015-10-26 MED ORDER — ALUM & MAG HYDROXIDE-SIMETH 200-200-20 MG/5ML PO SUSP: 30.0000 mL | ORAL | Status: DC | PRN

## 2015-10-26 MED ORDER — PHENOL 1.4 % MT LIQD: 1.0000 | OROMUCOSAL | Status: DC | PRN

## 2015-10-26 MED ORDER — ACETAMINOPHEN 325 MG PO TABS: 650.0000 mg | ORAL_TABLET | ORAL | Status: DC | PRN

## 2015-10-26 MED ORDER — ACETAMINOPHEN 325 MG PO TABS
650.0000 mg | ORAL_TABLET | Freq: Four times a day (QID) | ORAL | Status: DC | PRN
  Administered 2015-10-28 (×2): 650 mg via ORAL
  Filled 2015-10-26 (×3): qty 2

## 2015-10-26 NOTE — Discharge Instructions (Signed)
Follow with Primary MD   Get CBC, CMP,checked  by Primary MD next visit.    Activity: Ambulate with assistance   Disposition inpatient psychiatric facility at [place]: Regular with thin liquid  On your next visit with your primary care physician please Get Medicines reviewed and adjusted.   Please request your Prim.MD to go over all Hospital Tests and Procedure/Radiological results at the follow up, please get all Hospital records sent to your Prim MD by signing hospital release before you go home.   If you experience worsening of your admission symptoms, develop shortness of breath, life threatening emergency, suicidal or homicidal thoughts you must seek medical attention immediately by calling 911 or calling your MD immediately  if symptoms less severe.  You Must read complete instructions/literature along with all the possible adverse reactions/side effects for all the Medicines you take and that have been prescribed to you. Take any new Medicines after you have completely understood and accpet all the possible adverse reactions/side effects.   Do not drive, operating heavy machinery, perform activities at heights, swimming or participation in water activities or provide baby sitting services if your were admitted for syncope or siezures until you have seen by Primary MD or a Neurologist and advised to do so again.  Do not drive when taking Pain medications.    Do not take more than prescribed Pain, Sleep and Anxiety Medications  Special Instructions: If you have smoked or chewed Tobacco  in the last 2 yrs please stop smoking, stop any regular Alcohol  and or any Recreational drug use.  Wear Seat belts while driving.   Please note  You were cared for by a hospitalist during your hospital stay. If you have any questions about your discharge medications or the care you received while you were in the hospital after you are discharged, you can call the unit and asked to  speak with the hospitalist on call if the hospitalist that took care of you is not available. Once you are discharged, your primary care physician will handle any further medical issues. Please note that NO REFILLS for any discharge medications will be authorized once you are discharged, as it is imperative that you return to your primary care physician (or establish a relationship with a primary care physician if you do not have one) for your aftercare needs so that they can reassess your need for medications and monitor your lab values.

## 2015-10-26 NOTE — Clinical Social Work Psych Assess (Signed)
Clinical Social Work Nature conservation officer  Clinical Social Worker:  Lia Hopping, LCSW Date/Time:  10/26/2015, 4:41 PM Referred By:  Care Management Date Referred:  10/26/15 Reason for Referral:  Behavioral Health Issues   Presenting Symptoms/Problems  Presenting Symptoms/Problems(in person's/family's own words): Patient reports she has been under stress related to relationship with boyfriend about a year ago, part time job and financial difficulties. Patient reports feeling alone.  Patient admitted with suicide attempt with intentional drug overdose.   Abuse/Neglect/Trauma History  Abuse/Neglect/Trauma History:  Denies History Abuse/Neglect/Trauma History Comments (indicate dates):  N/A   Psychiatric History  Psychiatric History:  Inpatient/Hospitalization Psychiatric Medication:  Medications 10/26/15 10/27/15 10/28/15 10/29/15 10/30/15 10/31/15 11/01/15  antiseptic oral rinse (CPC / CETYLPYRIDINIUM CHLORIDE 0.05%) solution 7 mL Dose: 7 mL Freq: 2 times daily Route: Mouth Rinse Start: 10/23/15 2345   Admin Instructions:  This is NOT a pharmacy stocked item. Obtain from [facility] (SPD).   1000    2200     1000    2200     1000    2200     1000    2200     1000    2200     1000    2200     1000    2200      enoxaparin (LOVENOX) injection 40 mg Dose: 40 mg Freq: Every 24 hours Route: [place] Start: 10/22/15 1500   Admin Instructions:  Do NOT expel air bubble from syringe before giving.   (1500)[C]     1500     1500     1500     1500     1500     1500      famotidine (PEPCID) tablet 20 mg Dose: 20 mg Freq: 2 times daily Route: PO Start: 10/25/15 2200   0933    2200     1000    2200     1000    2200     1000    2200     1000    2200     1000    2200     1000    2200      fluvoxaMINE (LUVOX) tablet 50 mg Dose: 50 mg Freq: Daily at bedtime Route: PO Start:  10/25/15 2200   2200     2200     2200     2200     2200     2200     9038      folic acid (FOLVITE) tablet 1 mg Dose: 1 mg Freq: Daily Route: PO Start: 10/26/15 1000   0933     1000     1000     1000     1000     1000     1000      guaiFENesin (MUCINEX) 12 hr tablet 1,200 mg Dose: 1,200 mg Freq: 2 times daily Route: PO Start: 10/26/15 1400 End: 10/29/15 0959   Admin Instructions:  ** Do NOT crush **   1400    2200     1000    2200     1000    2200     0959-D/C'd       sodium chloride 0.9 % bolus 1,000 mL Dose: 1,000 mL Freq: Once Route: IV Start: 10/22/15 1330           thiamine (VITAMIN B-1) tablet 100 mg Dose: 100 mg Freq: Daily Route: PO  Start: 10/26/15 1000   0933     1000     1000     1000     1000     1000     1000      Medications 10/26/15 10/27/15 10/28/15 10/29/15 10/30/15 10/31/15 11/01/15      Current Mental Health Hospitalizations/Previous Mental Health History:  Patient admitted she was admitted to [facility] about ten to twelve times for suicide attempt after divorce from husband.   Current Provider:  Outpatient Dr. [name] at [facility].   Previous Inpatient Admission/Date/Reason:  SI attempt.    Emotional Health/Current Symptoms  Suicide/Self Harm: Suicide Attempt in the Past  Suicide Attempt in Past (date/description): 10/21/2015 Overdose with pills  Other Harmful Behavior (ex. homicidal ideation) (describe):  n/a   Psychotic/Dissociative Symptoms  Psychotic/Dissociative Symptoms: None Reported Other Psychotic/Dissociative Symptoms: n/a   Attention/Behavioral Symptoms  Attention/Behavioral Symptoms: Within Normal Limits Other Attention/Behavioral Symptoms:  Bipolar   Cognitive Impairment  Cognitive Impairment:  Within Normal Limits Other Cognitive Impairment: n/a   Mood and Adjustment  Mood and Adjustment:   (Calm)   Stress, Anxiety,  Trauma, Any Recent Loss/Stressor  Stress, Anxiety, Trauma, Any Recent Loss/Stressor: Relationship Anxiety (frequency):    Phobia (specify):  n/a  Compulsive Behavior (specify): n/a   Obsessive Behavior (specify): n/a  Other Stress, Anxiety, Trauma, Any Recent Loss/Stressor:  Patient stated she has been under multiple stresses related to broke up with her boyfriend about a year ago, part-time job, financial difficulties, being supported financially by mother who has been telling her she cannot support her any more, stressed about all the problems, reportedly feeling alone and then drank alcohol and in took intentional overdose to end her life.   Substance Abuse/Use  Substance Abuse/Use: Current Substance Use SBIRT Completed (please refer for detailed history): No Self-reported Substance Use (last use and frequency):  Admission Date.   Urinary Drug Screen Completed: Yes Alcohol Level:  271   Environment/Housing/Living Arrangement  Environmental/Housing/Living Arrangement: Stable Housing Who is in the Home:  Self  Emergency Contact:     Financial  Financial: Medicaid   Patient's Strengths and Goals  Patient's Strengths and Goals (patient's own words): " I will go to any behavioral at this time, I am interested in a facility in [place] if possible".   Clinical Social Worker's Interpretive Summary  Clinical Social Workers Interpretive Summary:  Patient has history of SI. Patient is willing to receive and support and help [facility] can provide. Patient in well versed in her diagnosis and treatments thus far. Patient is aware of the stressors in her life and its emotional impact. Patient cannot not contract for her safety at this time.  Patient feels has a strong support system, family at bedside and visitors. Patient reports she follows up with her appointments with Dr. Theadore Nan. She is also interested in [facility] which she expresses its going to be a great program to start after  inpatient.    Disposition  Disposition: Inpatient Referral Made [facility] [place])

## 2015-10-26 NOTE — Discharge Summary (Signed)
Jacqueline Greene, is a 45 y.o. female  DOB 1970-07-10  MRN [id number].  Admission date:  10/22/2015  Admitting Physician  Juanito Doom, MD  Discharge Date:  10/26/2015   Primary MD  No PCP Per Patient  Recommendations for primary care physician for things to follow:  - Please check CBC, BMP in 3 days  Admission Diagnosis  ingestion   Discharge Diagnosis  ingestion    Principal Problem:   Drug overdose, intentional ([place]) Active Problems:   Acute respiratory failure ([place])   Bipolar disorder with severe depression ([place])   OCD (obsessive compulsive disorder)      Past Medical History  Diagnosis Date  . Headache(784.0)   . Asthma   . Infection     UTI  . Fibromyalgia   . Cervical cancer ([place])   . Anxiety     Past Surgical History  Procedure Laterality Date  . Abdominal hysterectomy    . Cesarean section    . Dilation and curettage of uterus    . Fracture surgery      shattered knee cap  . Knee arthroscopy         History of present illness and  Hospital Course:     Kindly see H&P for history of present illness and admission details, please review complete Labs, Consult reports and Test reports for all details in brief  HPI  from the history and physical done on the day of admission 10/22/2015  This is a 45 year old female with a past medical history significant for alcohol abuse, bipolar disorder and a history of suicide attempt in the past who was brought to the [facility] emergency department on 10/22/2015. She had been struggling with insomnia and her family felt that her bipolar disorder was not very well controlled in the last several weeks. However, they had no suspicion of her considering suicide. They had been in contact with her as recent as late in the evening on 10/21/2015. Her sister-in-law received a text message at 7:03 AM on 10/22/2015 indicating that she had made a  suicide attempt. She did not reveal which medication she took. There were empty bottles of Klonopin and Lamictal noted nearby. She also had prescriptions for buspirone, as omeprazole, naproxen, and fluoxetine.  Hospital Course  45 year old female with a past medical history significant for bipolar disorder admitted on 10/22/2015 in the setting of likely alcohol overdose with Klonopin overdose and potentially Lamictal. Actual medications taken is uncertain. Required ventilation with mild hypotension and no evidence of end organ damage. QTc interval mildly prolonged on admit, And successfully extubated 5/30, Initially she denies suicidal intent, but currently she reports she called suicide line before her attempt, and she took multiple medication including Klonopin, Luvox, Celexa and Lamictal as a part of her overdose.   Acute encephalopathy - Secondary to Intentional overdose , Patient currently endorses she did take Klonopin, Lamictal, Luvox and Celexa  - Significantly improved, awake alert 3 coherent, back to baseline.  Acute respiratory failure secondary to inability to  protect airway - She required intubation on admission, successfully extubated 5/30 - Resolved, back to baseline on room air  Prolonged QTc - Repeat on 5/30 showing 483 on repeat EKG  Intentional overdose - Continue with suicide precaution - Psychiatry consulted, will need inpatient psych placement - Patient currently endorses she did take Klonopin, Lamictal, Luvox and Celexa , and she called suicide line before her attempt.  Transient hypotension secondary to medication effect - Resolved, blood pressure stable.  Hypokalemia - Repleted  Bipolar disorder - Resumed on Luvox - Further management per inpatient psychiatric facility  Discharge Condition:  Stable       Discharge Instructions  and  Discharge Medications    Discharge Instructions    Discharge instructions    Complete by:  As directed   Follow  with Primary MD   Get CBC, CMP,checked  by Primary MD next visit.    Activity: Ambulate with assistance   Disposition inpatient psychiatric facility at [place]: Regular with thin liquid  On your next visit with your primary care physician please Get Medicines reviewed and adjusted.   Please request your Prim.MD to go over all Hospital Tests and Procedure/Radiological results at the follow up, please get all Hospital records sent to your Prim MD by signing hospital release before you go home.   If you experience worsening of your admission symptoms, develop shortness of breath, life threatening emergency, suicidal or homicidal thoughts you must seek medical attention immediately by calling 911 or calling your MD immediately  if symptoms less severe.  You Must read complete instructions/literature along with all the possible adverse reactions/side effects for all the Medicines you take and that have been prescribed to you. Take any new Medicines after you have completely understood and accpet all the possible adverse reactions/side effects.   Do not drive, operating heavy machinery, perform activities at heights, swimming or participation in water activities or provide baby sitting services if your were admitted for syncope or siezures until you have seen by Primary MD or a Neurologist and advised to do so again.  Do not drive when taking Pain medications.    Do not take more than prescribed Pain, Sleep and Anxiety Medications  Special Instructions: If you have smoked or chewed Tobacco  in the last 2 yrs please stop smoking, stop any regular Alcohol  and or any Recreational drug use.  Wear Seat belts while driving.   Please note  You were cared for by a hospitalist during your hospital stay. If you have any questions about your discharge medications or the care you received while you were in the hospital after you are discharged, you can call the unit and asked to speak with  the hospitalist on call if the hospitalist that took care of you is not available. Once you are discharged, your primary care physician will handle any further medical issues. Please note that NO REFILLS for any discharge medications will be authorized once you are discharged, as it is imperative that you return to your primary care physician (or establish a relationship with a primary care physician if you do not have one) for your aftercare needs so that they can reassess your need for medications and monitor your lab values.            Medication List    STOP taking these medications        buPROPion 150 MG 24 hr tablet  Commonly known as:  WELLBUTRIN XL  clonazePAM 0.5 MG tablet  Commonly known as:  KLONOPIN     ibuprofen 200 MG tablet  Commonly known as:  ADVIL,MOTRIN     VIMOVO 500-20 MG Tbec  Generic drug:  Naproxen-Esomeprazole      TAKE these medications        acetaminophen 325 MG tablet  Commonly known as:  TYLENOL  Take 2 tablets (650 mg total) by mouth every 4 (four) hours as needed for mild pain or fever (temp > 101.5).     fluvoxaMINE 50 MG tablet  Commonly known as:  LUVOX  Take 1 tablet (50 mg total) by mouth at bedtime.     folic acid 1 MG tablet  Commonly known as:  FOLVITE  Take 1 tablet (1 mg total) by mouth daily.     guaiFENesin 600 MG 12 hr tablet  Commonly known as:  MUCINEX  Take 2 tablets (1,200 mg total) by mouth 2 (two) times daily. Please take for 3 days then stop     menthol-cetylpyridinium 3 MG lozenge  Commonly known as:  CEPACOL  Take 1 lozenge (3 mg total) by mouth as needed for sore throat.     phenol 1.4 % Liqd  Commonly known as:  CHLORASEPTIC  Use as directed 1 spray in the mouth or throat as needed for throat irritation / pain.     thiamine 100 MG tablet  Take 1 tablet (100 mg total) by mouth daily.          Diet and Activity recommendation: See Discharge Instructions above   Consults obtained - Pulmonary critical  care medicine Category   Major procedures and Radiology Reports - PLEASE review detailed and final reports for all details, in brief -   Intubated 5/29, extubated 5/30  Ct Head Wo Contrast  10/22/2015  CLINICAL DATA:  The bottle high overdose. EXAM: CT HEAD WITHOUT CONTRAST TECHNIQUE: Contiguous axial images were obtained from the base of the skull through the vertex without intravenous contrast. COMPARISON:  None. FINDINGS: There is no evidence of mass effect, midline shift or extra-axial fluid collections. There is no evidence of a space-occupying lesion or intracranial hemorrhage. There is no evidence of a cortical-based area of acute infarction. The ventricles and sulci are appropriate for the patient's age. The basal cisterns are patent. Visualized portions of the orbits are unremarkable. There is air-fluid level in the left maxillary sinus. There is mucosal thickening in the maxillary sinuses and ethmoid sinuses. The osseous structures are unremarkable. IMPRESSION: 1. No acute intracranial pathology. 2. Sinus disease as described above. Electronically Signed   By: Kathreen Devoid   On: 10/22/2015 10:52   Dg Chest Port 1 View  10/23/2015  CLINICAL DATA:  Asthma exacerbation. Ventilator dependent respiratory failure. Followup atelectasis. EXAM: PORTABLE CHEST 1 VIEW COMPARISON:  10/22/2015. FINDINGS: Endotracheal tube tip in satisfactory position projecting approximately 6 cm above the carina. Nasogastric tube courses below the diaphragm into the stomach. Suboptimal inspiration with atelectasis in the lung bases, slightly increased since yesterday. Cardiac silhouette normal in size. Pulmonary vascularity normal. No visible pleural effusions. IMPRESSION: 1. Support apparatus satisfactory. 2. Worsening bibasilar atelectasis. Electronically Signed   By: Evangeline Dakin M.D.   On: 10/23/2015 07:12   Dg Chest Portable 1 View  10/22/2015  CLINICAL DATA:  Status post intubation.  Asthma. EXAM: PORTABLE  CHEST 1 VIEW COMPARISON:  None. FINDINGS: Endotracheal tube with the tip 3 cm above the carina. Nasogastric tube coursing below the diaphragm. Bandlike area of airspace disease  at the right lung base consistent with atelectasis. There is no pleural effusion or pneumothorax. The heart and mediastinal contours are unremarkable. The osseous structures are unremarkable. IMPRESSION: 1. Endotracheal tube with the tip 3 cm above the carina. 2. Nasogastric tube coursing below the diaphragm. 3. Bandlike area of airspace disease at the right lung base consistent with atelectasis. Electronically Signed   By: Kathreen Devoid   On: 10/22/2015 09:26    Micro Results     Recent Results (from the past 240 hour(s))  Urine culture     Status: Abnormal   Collection Time: 10/22/15  9:05 AM  Result Value Ref Range Status   Specimen Description URINE, CATHETERIZED  Final   Special Requests NONE  Final   Culture 20,000 COLONIES/mL KLEBSIELLA OXYTOCA (A)  Final   Report Status 10/24/2015 FINAL  Final   Organism ID, Bacteria KLEBSIELLA OXYTOCA (A)  Final      Susceptibility   Klebsiella oxytoca - MIC*    AMPICILLIN 8 RESISTANT Resistant     CEFAZOLIN <=4 SENSITIVE Sensitive     CEFTRIAXONE <=1 SENSITIVE Sensitive     CIPROFLOXACIN <=0.25 SENSITIVE Sensitive     GENTAMICIN <=1 SENSITIVE Sensitive     IMIPENEM <=0.25 SENSITIVE Sensitive     NITROFURANTOIN 32 SENSITIVE Sensitive     TRIMETH/SULFA <=20 SENSITIVE Sensitive     AMPICILLIN/SULBACTAM <=2 SENSITIVE Sensitive     PIP/TAZO <=4 SENSITIVE Sensitive     * 20,000 COLONIES/mL KLEBSIELLA OXYTOCA  MRSA PCR Screening     Status: None   Collection Time: 10/22/15 12:45 PM  Result Value Ref Range Status   MRSA by PCR NEGATIVE NEGATIVE Final    Comment:        The GeneXpert MRSA Assay (FDA approved for NASAL specimens only), is one component of a comprehensive MRSA colonization surveillance program. It is not intended to diagnose MRSA infection nor to guide  or monitor treatment for MRSA infections.   Culture, respiratory (NON-Expectorated)     Status: None   Collection Time: 10/22/15  7:52 PM  Result Value Ref Range Status   Specimen Description TRACHEAL ASPIRATE  Final   Special Requests Normal  Final   Gram Stain   Final    ABUNDANT WBC PRESENT,BOTH PMN AND MONONUCLEAR FEW GRAM POSITIVE COCCI IN CLUSTERS IN CHAINS Performed at [place]  Final   Report Status 10/25/2015 FINAL  Final   Organism ID, Bacteria STAPHYLOCOCCUS AUREUS  Final      Susceptibility   Staphylococcus aureus - MIC*    CIPROFLOXACIN <=0.5 SENSITIVE Sensitive     ERYTHROMYCIN <=0.25 SENSITIVE Sensitive     GENTAMICIN <=0.5 SENSITIVE Sensitive     OXACILLIN <=0.25 SENSITIVE Sensitive     TETRACYCLINE <=1 SENSITIVE Sensitive     VANCOMYCIN 1 SENSITIVE Sensitive     TRIMETH/SULFA <=10 SENSITIVE Sensitive     CLINDAMYCIN <=0.25 SENSITIVE Sensitive     RIFAMPIN <=0.5 SENSITIVE Sensitive     Inducible Clindamycin NEGATIVE Sensitive     * MODERATE STAPHYLOCOCCUS AUREUS       Today   Subjective:   Jacqueline Greene today has no headache,no chest or abdominal pain, reports mild congestion and throat discomfort (due to recent intubation)  Blood pressure 144/99, pulse 73, temperature 99.2 F (37.3 C), temperature source Oral, resp. rate 16, height 5\' 10"  (1.778 m), weight 82.328 kg (181 lb 8 oz), SpO2 98 %.   Intake/Output Summary (Last 24 hours)  at 10/26/15 1504 Last data filed at 10/26/15 1245  Gross per 24 hour  Intake   5880 ml  Output      0 ml  Net   5880 ml    Exam Awake, Alert 3, Appropriate, no focal deficits [place].AT,PERRAL Supple Neck,No JVD Symmetrical Chest wall movement, Good air movement bilaterally, CTAB RRR,No Gallops,Rubs or new Murmurs, No Parasternal Heave +ve B.Sounds, Abd Soft, No tenderness No Cyanosis, Clubbing or edema, No new Rash or bruise   Data Review   CBC w Diff: Lab  Results  Component Value Date   WBC 8.1 10/26/2015   HGB 11.5* 10/26/2015   HCT 33.7* 10/26/2015   PLT 250 10/26/2015   LYMPHOPCT 48 10/22/2015   MONOPCT 7 10/22/2015   EOSPCT 6 10/22/2015   BASOPCT 1 10/22/2015    CMP: Lab Results  Component Value Date   NA 141 10/26/2015   K 3.6 10/26/2015   CL 108 10/26/2015   CO2 27 10/26/2015   BUN 6 10/26/2015   CREATININE 0.78 10/26/2015   PROT 5.6* 10/24/2015   ALBUMIN 2.9* 10/24/2015   BILITOT 1.1 10/24/2015   ALKPHOS 84 10/24/2015   AST 19 10/24/2015   ALT 20 10/24/2015  .   Total Time in preparing paper work, data evaluation and todays exam - 35 minutes  Jacqueline Greene M.D on 10/26/2015 at 3:04 PM  [facility]   Office  [phone]

## 2015-10-26 NOTE — Tx Team (Signed)
Initial Interdisciplinary Treatment Plan   PATIENT STRESSORS: Loss of relationship Marital or family conflict   PATIENT STRENGTHS: Ability for insight Capable of independent living Supportive family/friends   PROBLEM LIST: Problem List/Patient Goals Date to be addressed Date deferred Reason deferred Estimated date of resolution  Anxiety      Depression                                                 DISCHARGE CRITERIA:  Improved stabilization in mood, thinking, and/or behavior Verbal commitment to aftercare and medication compliance  PRELIMINARY DISCHARGE PLAN: Attend aftercare/continuing care group Return to previous living arrangement  PATIENT/FAMIILY INVOLVEMENT: This treatment plan has been presented to and reviewed with the patient, Jacqueline Greene.  The patient and family have been given the opportunity to ask questions and make suggestions.  Iver Fehrenbach W Rafik Koppel 10/26/2015, 6:18 PM

## 2015-10-26 NOTE — BH Assessment (Signed)
Patient has been accepted to [facility].  Accepting physician is Dr. Jerilee Hoh.  Attending Physician will be Dr. Alden Hipp.  Patient has been assigned to room 322-B, by [place].  Call report to [phone].  Representative/Transfer Coordinator is [employer] [place] (Elmyra Ricks, Education officer, museum) made aware of acceptance.

## 2015-10-26 NOTE — Progress Notes (Signed)
Patient states that she has been depressed for a long time. She cites various stressors such as breaking up with her partner of nine years, daughter who is about to move to another state, losing job, lack of sleep and lack of a social life among others. She states that she is frustrated by her current psychiatrist due to medication changes. "I feel up and down all over the place." "My life has turned into shit and I don't know how to turn it back again." Patient denies SI at this time and agrees to verbalize feelings to nursing staff. Patient property searched and contraband locked in safe. However, patient has a silver bracelet that she states that it cannot come out of her left hand because she has had it since she was 11 years. She also has two small silver rings on her left ear that cannot come out(staff tried to take it out but it appears stuck). Patient oriented to the unit, encouraged to talk to staff if any concerns.

## 2015-10-26 NOTE — Progress Notes (Deleted)
PROGRESS NOTE                                                                                                                                                                                                             Patient Demographics:    Jacqueline Greene, is a 45 y.o. female, DOB - [birth year], [id number]  Admit date - 10/22/2015   Admitting Physician Juanito Doom, MD  Outpatient Primary MD for the patient is No PCP Per Patient  LOS - 4  Chief Complaint  Patient presents with  . Ingestion  . Suicidal       Brief Narrative   45 year old female with a past medical history significant for bipolar disorder admitted on 10/22/2015 in the setting of likely alcohol overdose with Klonopin overdose and potentially Lamictal. Actual medications taken is uncertain. Required ventilation with mild hypotension and no evidence of end organ damage. QTc interval mildly prolonged on admit, And successfully extubated 5/30, Initially she denies suicidal intent, but currently she reports she called suicide line before her attempt, and she took multiple medication including Klonopin, Luvox, Celexa and Lamictal as a part of her overdose.   Subjective:    Jacqueline Greene today has, No headache, No chest pain, No abdominal pain , She had rash yesterday which has been resolved.   Assessment  & Plan :    Principal Problem:   Drug overdose, intentional ([place]) Active Problems:   Acute respiratory failure (HCC)   Bipolar disorder with severe depression ([place])   OCD (obsessive compulsive disorder)  Acute encephalopathy - Secondary to Intentional overdose , Patient  currently endorses she did take Klonopin, Lamictal, Luvox and Celexa  - Significantly improved, awake alert 3 coherent, back to baseline.  Acute respiratory failure secondary to inability to protect airway - She required intubation on admission, successfully extubated 5/30 - Resolved, back to baseline  on room air  Prolonged QTc - Repeat on 5/30 showing 483 on repeat EKG  Intentional overdose - Continue with suicide precaution - Psychiatry consulted, will need inpatient psych placement - Patient  currently endorses she did take Klonopin, Lamictal, Luvox and Celexa , and she called suicide line before her attempt.  Transient hypotension secondary to medication effect - Resolved, blood pressure stable, actually mildly elevated, will continue to monitor, will start on when necessary hydralazine.  Hypokalemia -  Repleted  Bipolar disorder - Resumed on Luvox  Code Status : Full  Family Communication  : Mother at bedside  Disposition Plan  : Medically cleared for inpatient psych admission, awaiting bed availability  Consults  :  PCCM, PsycH  Procedures  : Elevated 5/29, extubated 5/30  DVT Prophylaxis  :  Lovenox  Lab Results  Component Value Date   PLT 250 10/26/2015    Antibiotics  :   Anti-infectives    None        Objective:   Filed Vitals:   10/26/15 0630 10/26/15 0648 10/26/15 0700 10/26/15 1313  BP: 149/88   144/99  Pulse: 77   73  Temp: 98.7 F (37.1 C)   99.2 F (37.3 C)  TempSrc: Axillary   Oral  Resp: 15   16  Height:   5\' 10"  (1.778 m)   Weight:  82.328 kg (181 lb 8 oz)    SpO2: 100%   98%    Wt Readings from Last 3 Encounters:  10/26/15 82.328 kg (181 lb 8 oz)  02/17/13 68.493 kg (151 lb)     Intake/Output Summary (Last 24 hours) at 10/26/15 1336 Last data filed at 10/26/15 1245  Gross per 24 hour  Intake   5880 ml  Output      0 ml  Net   5880 ml     Physical Exam  Awake, Alert, appropriate. [place].AT,PERRAL Supple Neck,No JVD Symmetrical Chest wall movement, Good air movement bilaterally, CTAB RRR,No Gallops,Rubs or new Murmurs, No Parasternal Heave +ve B.Sounds, Abd Soft, No tenderness No Cyanosis, Clubbing or edema, No new Rash or bruise      Data Review:    CBC  Recent Labs Lab 10/22/15 0844 10/22/15 0855  10/23/15 0315 10/23/15 0959 10/25/15 0319 10/26/15 0425  WBC 10.0  --  13.4* 10.5 6.5 8.1  HGB 14.9 14.3 12.0 11.4* 12.4 11.5*  HCT 42.5 42.0 35.9* 35.2* 36.6 33.7*  PLT 248  --  253 224 217 250  MCV 86.9  --  92.5 92.9 88.2 86.9  MCH 30.5  --  30.9 30.1 29.9 29.6  MCHC 35.1  --  33.4 32.4 33.9 34.1  RDW 13.2  --  14.4 14.4 13.3 13.6  LYMPHSABS 4.9*  --   --   --   --   --   MONOABS 0.7  --   --   --   --   --   EOSABS 0.6  --   --   --   --   --   BASOSABS 0.1  --   --   --   --   --     Chemistries   Recent Labs Lab 10/22/15 0844  10/22/15 1534 10/23/15 0315 10/24/15 0316 10/25/15 0319 10/26/15 0425  NA 139  < > 145 142 139 141 141  K 3.8  < > 4.0 4.0 3.3* 3.4* 3.6  CL 106  < > 118* 113* 109 111 108  CO2 24  --  21* 24 23 23 27   GLUCOSE 142*  < > 120* 100* 86 138* 108*  BUN 10  < > 8 7 <5* 5* 6  CREATININE 0.83  < > 0.77 0.95 0.82 0.75 0.78  CALCIUM 8.3*  --  7.0* 7.2* 7.7* 8.4* 8.0*  MG  --   --  2.4 2.1  --   --   --   AST 37  --  24  --  19  --   --  ALT 33  --  25  --  20  --   --   ALKPHOS 98  --  79  --  84  --   --   BILITOT 0.8  --  0.6  --  1.1  --   --   < > = values in this interval not displayed. ------------------------------------------------------------------------------------------------------------------ No results for input(s): CHOL, HDL, LDLCALC, TRIG, CHOLHDL, LDLDIRECT in the last 72 hours.  No results found for: HGBA1C ------------------------------------------------------------------------------------------------------------------ No results for input(s): TSH, T4TOTAL, T3FREE, THYROIDAB in the last 72 hours.  Invalid input(s): FREET3 ------------------------------------------------------------------------------------------------------------------ No results for input(s): VITAMINB12, FOLATE, FERRITIN, TIBC, IRON, RETICCTPCT in the last 72 hours.  Coagulation profile No results for input(s): INR, PROTIME in the last 168 hours.  No  results for input(s): DDIMER in the last 72 hours.  Cardiac Enzymes No results for input(s): CKMB, TROPONINI, MYOGLOBIN in the last 168 hours.  Invalid input(s): CK ------------------------------------------------------------------------------------------------------------------ No results found for: BNP  Inpatient Medications  Scheduled Meds: . antiseptic oral rinse  7 mL Mouth Rinse BID  . enoxaparin (LOVENOX) injection  40 mg Subcutaneous Q24H  . famotidine  20 mg Oral BID  . fluvoxaMINE  50 mg Oral QHS  . folic acid  1 mg Oral Daily  . guaiFENesin  1,200 mg Oral BID  . sodium chloride  1,000 mL Intravenous Once  . thiamine  100 mg Oral Daily   Continuous Infusions: . sodium chloride Stopped (10/26/15 1055)   PRN Meds:.sodium chloride, acetaminophen, albuterol, menthol-cetylpyridinium, ondansetron (ZOFRAN) IV, phenol  Micro Results Recent Results (from the past 240 hour(s))  Urine culture     Status: Abnormal   Collection Time: 10/22/15  9:05 AM  Result Value Ref Range Status   Specimen Description URINE, CATHETERIZED  Final   Special Requests NONE  Final   Culture 20,000 COLONIES/mL KLEBSIELLA OXYTOCA (A)  Final   Report Status 10/24/2015 FINAL  Final   Organism ID, Bacteria KLEBSIELLA OXYTOCA (A)  Final      Susceptibility   Klebsiella oxytoca - MIC*    AMPICILLIN 8 RESISTANT Resistant     CEFAZOLIN <=4 SENSITIVE Sensitive     CEFTRIAXONE <=1 SENSITIVE Sensitive     CIPROFLOXACIN <=0.25 SENSITIVE Sensitive     GENTAMICIN <=1 SENSITIVE Sensitive     IMIPENEM <=0.25 SENSITIVE Sensitive     NITROFURANTOIN 32 SENSITIVE Sensitive     TRIMETH/SULFA <=20 SENSITIVE Sensitive     AMPICILLIN/SULBACTAM <=2 SENSITIVE Sensitive     PIP/TAZO <=4 SENSITIVE Sensitive     * 20,000 COLONIES/mL KLEBSIELLA OXYTOCA  MRSA PCR Screening     Status: None   Collection Time: 10/22/15 12:45 PM  Result Value Ref Range Status   MRSA by PCR NEGATIVE NEGATIVE Final    Comment:        The  GeneXpert MRSA Assay (FDA approved for NASAL specimens only), is one component of a comprehensive MRSA colonization surveillance program. It is not intended to diagnose MRSA infection nor to guide or monitor treatment for MRSA infections.   Culture, respiratory (NON-Expectorated)     Status: None   Collection Time: 10/22/15  7:52 PM  Result Value Ref Range Status   Specimen Description TRACHEAL ASPIRATE  Final   Special Requests Normal  Final   Gram Stain   Final    ABUNDANT WBC PRESENT,BOTH PMN AND MONONUCLEAR FEW GRAM POSITIVE COCCI IN CLUSTERS IN CHAINS Performed at [place]  Final  Report Status 10/25/2015 FINAL  Final   Organism ID, Bacteria STAPHYLOCOCCUS AUREUS  Final      Susceptibility   Staphylococcus aureus - MIC*    CIPROFLOXACIN <=0.5 SENSITIVE Sensitive     ERYTHROMYCIN <=0.25 SENSITIVE Sensitive     GENTAMICIN <=0.5 SENSITIVE Sensitive     OXACILLIN <=0.25 SENSITIVE Sensitive     TETRACYCLINE <=1 SENSITIVE Sensitive     VANCOMYCIN 1 SENSITIVE Sensitive     TRIMETH/SULFA <=10 SENSITIVE Sensitive     CLINDAMYCIN <=0.25 SENSITIVE Sensitive     RIFAMPIN <=0.5 SENSITIVE Sensitive     Inducible Clindamycin NEGATIVE Sensitive     * MODERATE STAPHYLOCOCCUS AUREUS    Radiology Reports Ct Head Wo Contrast  10/22/2015  CLINICAL DATA:  The bottle high overdose. EXAM: CT HEAD WITHOUT CONTRAST TECHNIQUE: Contiguous axial images were obtained from the base of the skull through the vertex without intravenous contrast. COMPARISON:  None. FINDINGS: There is no evidence of mass effect, midline shift or extra-axial fluid collections. There is no evidence of a space-occupying lesion or intracranial hemorrhage. There is no evidence of a cortical-based area of acute infarction. The ventricles and sulci are appropriate for the patient's age. The basal cisterns are patent. Visualized portions of the orbits are unremarkable. There is  air-fluid level in the left maxillary sinus. There is mucosal thickening in the maxillary sinuses and ethmoid sinuses. The osseous structures are unremarkable. IMPRESSION: 1. No acute intracranial pathology. 2. Sinus disease as described above. Electronically Signed   By: Kathreen Devoid   On: 10/22/2015 10:52   Dg Chest Port 1 View  10/23/2015  CLINICAL DATA:  Asthma exacerbation. Ventilator dependent respiratory failure. Followup atelectasis. EXAM: PORTABLE CHEST 1 VIEW COMPARISON:  10/22/2015. FINDINGS: Endotracheal tube tip in satisfactory position projecting approximately 6 cm above the carina. Nasogastric tube courses below the diaphragm into the stomach. Suboptimal inspiration with atelectasis in the lung bases, slightly increased since yesterday. Cardiac silhouette normal in size. Pulmonary vascularity normal. No visible pleural effusions. IMPRESSION: 1. Support apparatus satisfactory. 2. Worsening bibasilar atelectasis. Electronically Signed   By: Evangeline Dakin M.D.   On: 10/23/2015 07:12   Dg Chest Portable 1 View  10/22/2015  CLINICAL DATA:  Status post intubation.  Asthma. EXAM: PORTABLE CHEST 1 VIEW COMPARISON:  None. FINDINGS: Endotracheal tube with the tip 3 cm above the carina. Nasogastric tube coursing below the diaphragm. Bandlike area of airspace disease at the right lung base consistent with atelectasis. There is no pleural effusion or pneumothorax. The heart and mediastinal contours are unremarkable. The osseous structures are unremarkable. IMPRESSION: 1. Endotracheal tube with the tip 3 cm above the carina. 2. Nasogastric tube coursing below the diaphragm. 3. Bandlike area of airspace disease at the right lung base consistent with atelectasis. Electronically Signed   By: Kathreen Devoid   On: 10/22/2015 09:26    Time Spent in minutes  20 minutes   Darianne Muralles M.D on 10/26/2015 at 1:36 PM  Between 7am to 7pm - Pager - [phone]  After 7pm go to [email or web address] - password  [facility]  [facility] -  Office  [phone]

## 2015-10-26 NOTE — Progress Notes (Addendum)
Disposition: [facility].  LCSWA infomed [place], [place] MD. About patient Disposition. LCSWA met with patient and family at bedside. Completed Assessment. LCSWA informed patient and family about disposition to [facility] [place].  LCSWA obtained consent for voluntary commitment from patient. Retrieved report number from [facility], relayed message to nurse.  Completed Therapist, sports. [place] informed.   Kathrin Greathouse, Latanya Presser, MSW Clinical Social Worker 5E and Psychiatric Service Line [phone] 10/26/2015  3:38 PM

## 2015-10-26 NOTE — Progress Notes (Addendum)
Disposition: Recommend psychiatric Inpatient admission,  LCSWA made referrals to: [facility]: Pending  [facility]: Pending  [facility]: [place]:  Pending  LCSWA will continue to follow patient for disposition.  Kathrin Greathouse, Latanya Presser, MSW Clinical Social Worker 5E and Psychiatric Service Line [phone] 10/26/2015  11:40 AM

## 2015-10-27 DIAGNOSIS — F314 Bipolar disorder, current episode depressed, severe, without psychotic features: Secondary | ICD-10-CM

## 2015-10-27 MED ORDER — FLUVOXAMINE MALEATE 50 MG PO TABS
50.0000 mg | ORAL_TABLET | Freq: Every day | ORAL | Status: DC
  Administered 2015-10-28: 50 mg via ORAL
  Filled 2015-10-27: qty 1

## 2015-10-27 MED ORDER — BUPROPION HCL ER (XL) 150 MG PO TB24
150.0000 mg | ORAL_TABLET | Freq: Every day | ORAL | Status: DC
  Administered 2015-10-28: 150 mg via ORAL
  Filled 2015-10-27 (×2): qty 1

## 2015-10-27 MED ORDER — LAMOTRIGINE 25 MG PO TABS
25.0000 mg | ORAL_TABLET | Freq: Every day | ORAL | Status: DC
  Administered 2015-10-27 – 2015-10-30 (×4): 25 mg via ORAL
  Filled 2015-10-27 (×6): qty 1

## 2015-10-27 NOTE — Progress Notes (Signed)
D: Patient is alert and oriented on the unit this shift. Patient did not attend  and   Participate  in groups today. Patient denies suicidal ideation, homicidal ideation, auditory or visual hallucinations at the present time.  A: Scheduled medications are administered to patient as per MD orders. Emotional support and encouragement are provided. Patient is maintained on q.15 minute safety checks. Patient is informed to notify staff with questions or concerns. R: No adverse medication reactions are noted. Patient is cooperative with medication administration and treatment plan today. Patient is semi receptive, calm and cooperative on the unit at this time. Patient does not interact   with others on the unit this shift. Patient contracts for safety at this time. Patient remains safe at this time.

## 2015-10-27 NOTE — Plan of Care (Signed)
Problem: Coping: Goal: Ability to cope will improve Outcome: Progressing Patient states when asked this shift what are her ways to cope and she has a plan in place [employer] RN
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
## 2015-10-27 NOTE — [facility] Group Notes (Signed)
[place] LCSW Group Therapy  10/27/2015 1:37 PM  Type of Therapy:  Group Therapy  Participation Level:  Did Not Attend  Modes of Intervention:  Discussion, Education, Socialization and Support  Summary of Progress/Problems: Pt will identify unhealthy thoughts and how they impact their emotions and behavior. Pt will be encouraged to discuss these thoughts, emotions and behaviors with the group.   [place] MSW, [place]  10/27/2015, 1:37 PM
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
## 2015-10-27 NOTE — Progress Notes (Signed)
Jacqueline Greene spent most of shift resting in bed. She refused her wellbutrin because she felt like it wired her up at night, writer instructed her to talk to her doctor about it tomorro . She was pleasant on approach but drowsy. She appears to be resting in bed quietly.

## 2015-10-27 NOTE — H&P (Signed)
Jacqueline Greene is an 45 y.o. female.   Chief Complaint: "I'm just really depressed" HPI: Patient interviewed. Chart reviewed. This 45 year old woman with long-standing mood problems was transferred from [facility] where she had been treated for a few days in the intensive care unit after an overdose. Patient had overdosed on multiple psychiatric medicines. Mood had been feeling depressed for quite a while. She cites many stresses in her life but most of them boil down to being lonely and having no work and feeling like she has no support from anyone. Medically she appears to be in pretty good shape. She had been seeing an outpatient psychiatrist and was compliant with her prescribed medicine. Denies that she had been abusing drugs or alcohol. Not currently working. Feels she has few friends and little contact with anyone who is supportive.  Past Medical History  Diagnosis Date  . Headache(784.0)   . Asthma   . Infection     UTI  . Fibromyalgia   . Cervical cancer ([place])   . Anxiety     Past Surgical History  Procedure Laterality Date  . Abdominal hysterectomy    . Cesarean section    . Dilation and curettage of uterus    . Fracture surgery      shattered knee cap  . Knee arthroscopy      Family History  Problem Relation Age of Onset  . Anxiety disorder Mother   . Hypertension Father   . Alcohol abuse Father   . Heart disease Paternal Uncle   . Cancer Maternal Grandmother     breast  . Heart disease Maternal Grandmother   . Diabetes Paternal Grandmother   . Heart disease Paternal Grandmother    Social History:  reports that she has quit smoking. She uses smokeless tobacco. She reports that she does not drink alcohol or use illicit drugs.  Allergies:  Allergies  Allergen Reactions  . Pollen Extract     Medications Prior to Admission  Medication Sig Dispense Refill  . acetaminophen (TYLENOL) 325 MG tablet Take 2 tablets (650 mg total) by mouth every 4 (four) hours as  needed for mild pain or fever (temp > 101.5).    . fluvoxaMINE (LUVOX) 50 MG tablet Take 1 tablet (50 mg total) by mouth at bedtime.    . folic acid (FOLVITE) 1 MG tablet Take 1 tablet (1 mg total) by mouth daily.    Marland Kitchen guaiFENesin (MUCINEX) 600 MG 12 hr tablet Take 2 tablets (1,200 mg total) by mouth 2 (two) times daily. Please take for 3 days then stop    . menthol-cetylpyridinium (CEPACOL) 3 MG lozenge Take 1 lozenge (3 mg total) by mouth as needed for sore throat. 100 tablet 12  . phenol (CHLORASEPTIC) 1.4 % LIQD Use as directed 1 spray in the mouth or throat as needed for throat irritation / pain.  0  . thiamine 100 MG tablet Take 1 tablet (100 mg total) by mouth daily.      Results for orders placed or performed during the hospital encounter of 10/22/15 (from the past 48 hour(s))  CBC     Status: Abnormal   Collection Time: 10/26/15  4:25 AM  Result Value Ref Range   WBC 8.1 4.0 - 10.5 K/uL   RBC 3.88 3.87 - 5.11 MIL/uL   Hemoglobin 11.5 (L) 12.0 - 15.0 g/dL   HCT 33.7 (L) 36.0 - 46.0 %   MCV 86.9 78.0 - 100.0 fL   MCH 29.6 26.0 -  34.0 pg   MCHC 34.1 30.0 - 36.0 g/dL   RDW 13.6 11.5 - 15.5 %   Platelets 250 150 - 400 K/uL  Basic metabolic panel     Status: Abnormal   Collection Time: 10/26/15  4:25 AM  Result Value Ref Range   Sodium 141 135 - 145 mmol/L   Potassium 3.6 3.5 - 5.1 mmol/L   Chloride 108 101 - 111 mmol/L   CO2 27 22 - 32 mmol/L   Glucose, Bld 108 (H) 65 - 99 mg/dL   BUN 6 6 - 20 mg/dL   Creatinine, Ser 0.78 0.44 - 1.00 mg/dL   Calcium 8.0 (L) 8.9 - 10.3 mg/dL   GFR calc non Af Amer >60 >60 mL/min   GFR calc Af Amer >60 >60 mL/min    Comment: (NOTE) The eGFR has been calculated using the CKD EPI equation. This calculation has not been validated in all clinical situations. eGFR's persistently <60 mL/min signify possible Chronic Kidney Disease.    Anion gap 6 5 - 15   No results found.  Review of Systems  Constitutional: Negative.   HENT: Negative.    Eyes: Negative.   Respiratory: Negative.   Cardiovascular: Negative.   Gastrointestinal: Negative.   Musculoskeletal: Negative.   Skin: Negative.   Neurological: Negative.   Psychiatric/Behavioral: Positive for depression and suicidal ideas. Negative for hallucinations, memory loss and substance abuse. The patient is nervous/anxious and has insomnia.     Blood pressure 126/90, pulse 111, temperature 99.2 F (37.3 C), temperature source Oral, resp. rate 18, height 5' 10"  (1.778 m), weight 81.194 kg (179 lb), SpO2 96 %. Physical Exam  Nursing note and vitals reviewed. Constitutional: She appears well-developed and well-nourished.  HENT:  Head: Normocephalic and atraumatic.  Eyes: Conjunctivae are normal. Pupils are equal, round, and reactive to light.  Neck: Normal range of motion.  Cardiovascular: Normal heart sounds.   Respiratory: Effort normal.  GI: Soft.  Musculoskeletal: Normal range of motion.  Neurological: She is alert.  Skin: Skin is warm and dry.  Psychiatric: Her speech is normal and behavior is normal. Her mood appears anxious. Cognition and memory are normal. She expresses impulsivity. She exhibits a depressed mood. She expresses suicidal ideation.     Assessment/Plan Patient is a neatly dressed woman who looks her stated age. Cooperative with the interview but frequently tells me that she finds it anxiety provoking to have a conversation with me even though she understands that I need to get her history. Patient is able to describe chronic depression. Feels like she has a lot of stress. She is not describing any psychotic symptoms and not describing any manic symptoms. She does say she has recently been having more anxiety attacks. Patient has a positive family history of depression and at least 2 people in her family who have committed suicide.  Depression possible bipolar 2. Multiple medicines in the past. According to the patient most recently she had been on  Wellbutrin, Celexa, lamotrigine, Klonopin and Luvox. I can't get this to match up perfectly with the chart but I do see all of these medicines mentioned at one point or another. Plan is to restart the Lamictal 25 mg a day and restart Wellbutrin 150 mg a day as well as Luvox 50 mg at night. Klonopin can be given when necessary. Monitor vitals as usual. Engage patient in groups and activities on the unit. Patient will meet with social work and primary treatment team and her suicidality will be  monitored daily. Medicine changes as needed.  Alethia Berthold, MD 10/27/2015, 6:52 PM

## 2015-10-27 NOTE — BHH Suicide Risk Assessment (Signed)
[facility] Admission Suicide Risk Assessment   Nursing information obtained from:    Demographic factors:    Current Mental Status:    Loss Factors:    Historical Factors:    Risk Reduction Factors:     Total Time spent with patient: 1 hour Principal Problem: Bipolar disorder with severe depression ([place]) Diagnosis:   Patient Active Problem List   Diagnosis Date Noted  . Overdose [T50.901A] 10/26/2015  . OCD (obsessive compulsive disorder) [F42.9]   . Bipolar disorder with severe depression ([place]) [F31.4] 10/24/2015  . Acute respiratory failure ([place]) [J96.00]   . Drug overdose, intentional ([place]) [T50.902A] 10/22/2015  . Arthritis, degenerative [M19.90] 02/18/2012   Subjective Data: Patient transferred from [facility]. She was admitted there for a few days for drug overdose. Took intentional drug overdose with suicidal intent. Continues to be very depressed feels hopeless about her life wishes she were dead. Patient had been compliant with medications. Feels like she is under a lot of stress in her life but most of it is about chronic loneliness. Denies that she had been abusing substances. Continues to feel anxious and depressed and hopeless.  Continued Clinical Symptoms:  Alcohol Use Disorder Identification Test Final Score (AUDIT): 0 The "Alcohol Use Disorders Identification Test", Guidelines for Use in Primary Care, Second Edition.  World Pharmacologist [facility]). Score between 0-7:  no or low risk or alcohol related problems. Score between 8-15:  moderate risk of alcohol related problems. Score between 16-19:  high risk of alcohol related problems. Score 20 or above:  warrants further diagnostic evaluation for alcohol dependence and treatment.   CLINICAL FACTORS:   Bipolar Disorder:   Bipolar II Depression:   Impulsivity   Musculoskeletal: Strength & Muscle Tone: within normal limits Gait & Station: normal Patient leans: N/A  Psychiatric Specialty Exam: Physical Exam  ROS   Blood pressure 126/90, pulse 111, temperature 99.2 F (37.3 C), temperature source Oral, resp. rate 18, height 5\' 10"  (1.778 m), weight 81.194 kg (179 lb), SpO2 96 %.Body mass index is 25.68 kg/(m^2).  General Appearance: Fairly Groomed  Eye Contact:  Good  Speech:  Normal Rate  Volume:  Decreased  Mood:  Depressed  Affect:  Congruent  Thought Process:  Goal Directed  Orientation:  Full (Time, Place, and Person)  Thought Content:  Logical  Suicidal Thoughts:  Yes.  with intent/plan  Homicidal Thoughts:  No  Memory:  Immediate;   Good Recent;   Fair Remote;   Fair  Judgement:  Impaired  Insight:  Fair  Psychomotor Activity:  Decreased  Concentration:  Concentration: Fair  Recall:  [employer] of Knowledge:  Fair  Language:  Fair  Akathisia:  No  Handed:  Right  AIMS (if indicated):     Assets:  Communication Skills Desire for Improvement Financial Resources/Insurance Physical Health Resilience  ADL's:  Intact  Cognition:  WNL  Sleep:  Number of Hours: 7.3      COGNITIVE FEATURES THAT CONTRIBUTE TO RISK:  Polarized thinking    SUICIDE RISK:   Moderate:  Frequent suicidal ideation with limited intensity, and duration, some specificity in terms of plans, no associated intent, good self-control, limited dysphoria/symptomatology, some risk factors present, and identifiable protective factors, including available and accessible social support.  PLAN OF CARE: Admitted to psychiatric ward. 15 minute checks. Restart medicine for bipolar depression. Supportive counseling and therapy. Engage in groups and activities on the unit. On appropriate treatment at discharge planning  I certify that inpatient services furnished  can reasonably be expected to improve the patient's condition.   Alethia Berthold, MD 10/27/2015, 6:50 PM

## 2015-10-27 NOTE — BHH Group Notes (Signed)
[facility] Group Notes:  (Nursing/MHT/Case Management/Adjunct)  Date:  10/27/2015  Time:  9:43 AM  Type of Therapy:  Goal setting   Participation Level:  Did Not Attend  [name] 10/27/2015, 9:43 AM

## 2015-10-28 NOTE — Plan of Care (Signed)
Problem: Education: Goal: Emotional status will improve Outcome: Progressing Patient is brighter and is hopeful about the future.

## 2015-10-28 NOTE — Progress Notes (Signed)
D: Pt denies SI/HI/AV. Pt is pleasant and cooperative. Pt goal for today is to attend group and interact with peers. A: Pt was offered support and encouragement. Pt was given scheduled medications. Pt was encourage to attend groups. Q 15 minute checks were done for safety.  R:Pt attends groups and interacts well with peers and staff. Pt has no complaints.Pt receptive to treatment and safety maintained on unit.

## 2015-10-28 NOTE — Progress Notes (Signed)
[facility] MD Progress Note  10/28/2015 6:59 PM Jacqueline Greene  MRN:  [id number] Subjective:  Patient today denies suicidal ideation. Says her mood is still down but she is not suicidal not hopeless. Feels anxious. Dislikes being here in the hospital and is requesting transfer. No physical complaints. Vital signs stable. Physical exam unchanged Principal Problem: Bipolar disorder with severe depression ([place]) Diagnosis:   Patient Active Problem List   Diagnosis Date Noted  . Overdose [T50.901A] 10/26/2015  . OCD (obsessive compulsive disorder) [F42.9]   . Bipolar disorder with severe depression ([place]) [F31.4] 10/24/2015  . Acute respiratory failure ([place]) [J96.00]   . Drug overdose, intentional ([place]) [T50.902A] 10/22/2015  . Arthritis, degenerative [M19.90] 02/18/2012   Total Time spent with patient: 30 minutes  Past Psychiatric History: Past history of recurrent depression and obsessive-compulsive disorder. Recent suicidality. Patient has had multiple dictation management and therapy as an outpatient. Poor outpatient functioning.  Past Medical History:  Past Medical History  Diagnosis Date  . Headache(784.0)   . Asthma   . Infection     UTI  . Fibromyalgia   . Cervical cancer ([place])   . Anxiety     Past Surgical History  Procedure Laterality Date  . Abdominal hysterectomy    . Cesarean section    . Dilation and curettage of uterus    . Fracture surgery      shattered knee cap  . Knee arthroscopy     Family History:  Family History  Problem Relation Age of Onset  . Anxiety disorder Mother   . Hypertension Father   . Alcohol abuse Father   . Heart disease Paternal Uncle   . Cancer Maternal Grandmother     breast  . Heart disease Maternal Grandmother   . Diabetes Paternal Grandmother   . Heart disease Paternal Grandmother    Family Psychiatric  History: Denies any Social History:  History  Alcohol Use No     History  Drug Use No    Social History   Social History   . Marital Status: Divorced    Spouse Name: N/A  . Number of Children: N/A  . Years of Education: N/A   Social History Main Topics  . Smoking status: Former Research scientist (life sciences)  . Smokeless tobacco: Current User     Comment: e cig - 3 mg/ 4years  . Alcohol Use: No  . Drug Use: No  . Sexual Activity: Yes   Other Topics Concern  . None   Social History Narrative   Additional Social History:                         Sleep: Fair  Appetite:  Poor  Current Medications: Current Facility-Administered Medications  Medication Dose Route Frequency Provider Last Rate Last Dose  . acetaminophen (TYLENOL) tablet 650 mg  650 mg Oral Q6H PRN Hildred Priest, MD   650 mg at 10/28/15 1308  . alum & mag hydroxide-simeth (MAALOX/MYLANTA) 200-200-20 MG/5ML suspension 30 mL  30 mL Oral Q4H PRN Hildred Priest, MD      . buPROPion (WELLBUTRIN XL) 24 hr tablet 150 mg  150 mg Oral Daily Gonzella Lex, MD   150 mg at 10/28/15 1043  . famotidine (PEPCID) tablet 20 mg  20 mg Oral BID Hildred Priest, MD   20 mg at 10/28/15 1043  . fluvoxaMINE (LUVOX) tablet 50 mg  50 mg Oral QHS Hildred Priest, MD   50 mg at 10/27/15 2214  .  fluvoxaMINE (LUVOX) tablet 50 mg  50 mg Oral QHS Gonzella Lex, MD   50 mg at 10/27/15 2218  . guaiFENesin (MUCINEX) 12 hr tablet 1,200 mg  1,200 mg Oral BID Hildred Priest, MD   1,200 mg at 10/28/15 1043  . lamoTRIgine (LAMICTAL) tablet 25 mg  25 mg Oral Daily Gonzella Lex, MD   25 mg at 10/28/15 1043  . magnesium hydroxide (MILK OF MAGNESIA) suspension 30 mL  30 mL Oral Daily PRN Hildred Priest, MD      . menthol-cetylpyridinium (CEPACOL) lozenge 3 mg  1 lozenge Oral PRN Hildred Priest, MD      . phenol (CHLORASEPTIC) mouth spray 1 spray  1 spray Mouth/Throat PRN Hildred Priest, MD   1 spray at 10/28/15 1309  . traZODone (DESYREL) tablet 50 mg  50 mg Oral QHS PRN Hildred Priest, MD         Lab Results: No results found for this or any previous visit (from the past 48 hour(s)).  Blood Alcohol level:  Lab Results  Component Value Date   [facility] 271* 10/22/2015    Physical Findings: AIMS: Facial and Oral Movements Muscles of Facial Expression: None, normal Lips and Perioral Area: None, normal Jaw: None, normal Tongue: None, normal,Extremity Movements Upper (arms, wrists, hands, fingers): None, normal Lower (legs, knees, ankles, toes): None, normal, Trunk Movements Neck, shoulders, hips: None, normal, Overall Severity Severity of abnormal movements (highest score from questions above): None, normal Incapacitation due to abnormal movements: None, normal Patient's awareness of abnormal movements (rate only patient's report): No Awareness, Dental Status Current problems with teeth and/or dentures?: No Does patient usually wear dentures?: No  CIWA:    COWS:     Musculoskeletal: Strength & Muscle Tone: within normal limits Gait & Station: normal Patient leans: N/A  Psychiatric Specialty Exam: Physical Exam  Nursing note and vitals reviewed. Constitutional: She appears well-developed and well-nourished.  HENT:  Head: Normocephalic and atraumatic.  Eyes: Conjunctivae are normal. Pupils are equal, round, and reactive to light.  Neck: Normal range of motion.  Cardiovascular: Regular rhythm and normal heart sounds.   Respiratory: Effort normal. No respiratory distress.  GI: Soft.  Musculoskeletal: Normal range of motion.  Neurological: She is alert.  Skin: Skin is warm and dry.  Psychiatric: She has a normal mood and affect. Her behavior is normal. Judgment and thought content normal.    Review of Systems  Constitutional: Negative.   HENT: Negative.   Eyes: Negative.   Respiratory: Negative.   Cardiovascular: Negative.   Gastrointestinal: Negative.   Musculoskeletal: Negative.   Skin: Negative.   Neurological: Negative.   Psychiatric/Behavioral: Positive for  depression. Negative for suicidal ideas, hallucinations, memory loss and substance abuse. The patient is nervous/anxious and has insomnia.     Blood pressure 136/93, pulse 87, temperature 98.6 F (37 C), temperature source Oral, resp. rate 18, height 5\' 10"  (1.778 m), weight 81.194 kg (179 lb), SpO2 96 %.Body mass index is 25.68 kg/(m^2).  General Appearance: Fairly Groomed  Eye Contact:  Good  Speech:  Clear and Coherent  Volume:  Decreased  Mood:  Depressed  Affect:  Blunt  Thought Process:  Goal Directed  Orientation:  Full (Time, Place, and Person)  Thought Content:  Negative  Suicidal Thoughts:  No  Homicidal Thoughts:  No  Memory:  Immediate;   Good Recent;   Fair Remote;   Fair  Judgement:  Fair  Insight:  Fair  Psychomotor Activity:  Decreased  Concentration:  Concentration: Fair  Recall:  [employer] of Knowledge:  Fair  Language:  Fair  Akathisia:  No  Handed:  Right  AIMS (if indicated):     Assets:  Communication Skills Desire for Improvement Housing Physical Health Resilience  ADL's:  Intact  Cognition:  WNL  Sleep:  Number of Hours: 7     Treatment Plan Summary: Daily contact with patient to assess and evaluate symptoms and progress in treatment, Medication management and Plan Patient appears to be stable today. She wants to discontinue her bupropion. She thinks it is making her agitated. I will discontinue that. Continue other medicine as prescribed. Encourage patient to attend groups. Patient is requesting transfer. I advised her that she needs to discuss that with her primary team tomorrow.  Alethia Berthold, MD 10/28/2015, 6:59 PM

## 2015-10-28 NOTE — Progress Notes (Signed)
D: Patient appears bright. States her anxiety and depression has gone down to a 3. States she's hopeful about the future and has new plans to help her when she gets discharged. She denies SI/HI/AVH. She has been visible in the milieu interacting with peers. Only complaints is throat pain from former intubation. A: Medication given with education. Encouragement provided. Choloraseptic and Tylenol given for throat.  R: Patient was compliant with medication. She has remained calm and cooperative. Safety maintained with 15 min checks.

## 2015-10-28 NOTE — BHH Group Notes (Signed)
[place] LCSW Group Therapy  10/28/2015 3:15 PM  Type of Therapy:  Group Therapy  Participation Level:  Did Not Attend  Modes of Intervention:  Discussion, Education, Socialization and Support  Summary of Progress/Problems: Emotional Regulation: Patients will identify both negative and positive emotions. They will discuss emotions they have difficulty regulating and how they impact their lives. Patients will be asked to identify healthy coping skills to combat unhealthy reactions to negative emotions.     [place] MSW, [place]  10/28/2015, 3:15 PM

## 2015-10-29 ENCOUNTER — Encounter: Payer: Self-pay | Admitting: Psychiatry

## 2015-10-29 DIAGNOSIS — F3181 Bipolar II disorder: Secondary | ICD-10-CM

## 2015-10-29 LAB — VITAMIN B12: Vitamin B-12: 386 pg/mL (ref 180–914)

## 2015-10-29 LAB — TSH: TSH: 1.485 u[IU]/mL (ref 0.350–4.500)

## 2015-10-29 MED ORDER — QUETIAPINE FUMARATE 100 MG PO TABS
100.0000 mg | ORAL_TABLET | Freq: Every day | ORAL | Status: DC
  Administered 2015-10-29: 100 mg via ORAL
  Filled 2015-10-29: qty 1

## 2015-10-29 NOTE — Progress Notes (Signed)
D: Patient is alert and oriented on the unit this shift. Patient attended   groups today. Patient denies suicidal ideation, homicidal ideation, auditory or visual hallucinations at the present time.  A: Scheduled medications are administered to patient as per MD orders. Emotional support and encouragement are provided. Patient is maintained on q.15 minute safety checks. Patient is informed to notify staff with questions or concerns. R: No adverse medication reactions are noted. Patient is cooperative with medication administration and treatment plan today. Patient is receptive, calm and cooperative on the unit at this time. Patient does not  Interact  with others on the unit this shift. Patient contracts for safety at this time. Patient remains safe at this time.

## 2015-10-29 NOTE — Progress Notes (Signed)
Recreation Therapy Notes  Date: 06.05.17 Time: 1:00 pm Location: Craft Room  Group Topic: Wellness  Goal Area(s) Addresses:  Patient will identify at least one item per dimension of health Patient will examine areas they are deficient in.  Behavioral Response: Attentive, Interactive  Intervention: 6 Dimensions of Health  Activity: Patients were given a definition sheet of the 6 Dimensions of Health and a worksheet. Patients were instructed to write 2-3 things in each category.  Education: LRT educated patients on how this activity relates to their admission and d/c.  Education Outcome: Acknowledges education/In group clarification offered/Needs additional education.   Clinical Observations/Feedback: Patient completed activity by writing at least 2 items in each category. Patient contributed to group discussion by stating what areas she was giving enough attention to, what areas she was not giving enough attention to, what she can do to increase certain areas, how this activity relates to her admission, and how this activity relates to her d/c.  Leonette Monarch, LRT/CTRS 10/29/2015 2:30 PM

## 2015-10-29 NOTE — Plan of Care (Signed)
Problem: Self-Concept: Goal: Ability to disclose and discuss suicidal ideas will improve Outcome: Progressing Pt able to discuss stressors causing SI

## 2015-10-29 NOTE — Progress Notes (Signed)
[facility] MD Progress Note  10/29/2015 12:53 PM Jacqueline Greene  MRN:  [id number] Subjective:  Patient today denies suicidal ideation. Says her mood is still down but she is not suicidal not hopeless. Feels anxious.   Patient tells me she has multiple plans in place to prevent this from happening again. She is going to move to [facility] where she has more family and support. She also wants to switch psychiatrist and go to a mood disorder clinic in [place] after discharge from the hospital. He says that her diagnosis has been very confusing and she has been tried on a multitude of medications. As stated that she has been told she has ADD, excessive compulsive disorder and bipolar unspecified.  This is her second suicidal attempt. 2003 she overdosed on medications after having a fight with her husband.  Her most recent suicidal attempt was quite severe as the patient required intubation  Patient described symptoms appear very clearly describing hypomanic episodes. Patient stated that she has gotten into significant arguments at work and has displaced severe irritability that can last for up to 7 days. She also has found herself being more impulsive during these times. She says she bought a mattress that was $7000 and she did not have the money. She also has purchased and other items on the Internet and he has adopted 2 dogs that she was unable to support.    Principal Problem: Bipolar 2 disorder, major depressive episode ([place]) Diagnosis:   Patient Active Problem List   Diagnosis Date Noted  . Bipolar 2 disorder, major depressive episode ([place]) [F31.81] 10/29/2015  . Drug overdose, intentional ([place]) [T50.902A] 10/22/2015  . Arthritis, degenerative [M19.90] 02/18/2012   Total Time spent with patient: 30 minutes  Past Psychiatric History: Past history of recurrent depression and obsessive-compulsive disorder. Recent suicidality. Patient has had multiple dictation management and therapy  as an outpatient. Poor outpatient functioning.  Past Medical History:  Past Medical History  Diagnosis Date  . Headache(784.0)   . Asthma   . Infection     UTI  . Fibromyalgia   . Cervical cancer ([place])   . Anxiety     Past Surgical History  Procedure Laterality Date  . Abdominal hysterectomy    . Cesarean section    . Dilation and curettage of uterus    . Fracture surgery      shattered knee cap  . Knee arthroscopy     Family History:  Family History  Problem Relation Age of Onset  . Anxiety disorder Mother   . Hypertension Father   . Alcohol abuse Father   . Heart disease Paternal Uncle   . Cancer Maternal Grandmother     breast  . Heart disease Maternal Grandmother   . Diabetes Paternal Grandmother   . Heart disease Paternal Grandmother    Family Psychiatric  History: Denies any Social History:  History  Alcohol Use No     History  Drug Use No    Social History   Social History  . Marital Status: Divorced    Spouse Name: N/A  . Number of Children: N/A  . Years of Education: N/A   Social History Main Topics  . Smoking status: Former Research scientist (life sciences)  . Smokeless tobacco: Current User     Comment: e cig - 3 mg/ 4years  . Alcohol Use: No  . Drug Use: No  . Sexual Activity: Yes   Other Topics Concern  . None   Social History Narrative  Current Medications: Current Facility-Administered Medications  Medication Dose Route Frequency Provider Last Rate Last Dose  . acetaminophen (TYLENOL) tablet 650 mg  650 mg Oral Q6H PRN Hildred Priest, MD   650 mg at 10/28/15 2111  . alum & mag hydroxide-simeth (MAALOX/MYLANTA) 200-200-20 MG/5ML suspension 30 mL  30 mL Oral Q4H PRN Hildred Priest, MD      . famotidine (PEPCID) tablet 20 mg  20 mg Oral BID Hildred Priest, MD   20 mg at 10/29/15 0815  . lamoTRIgine (LAMICTAL) tablet 25 mg  25 mg Oral Daily Gonzella Lex, MD   25 mg at 10/29/15 0815  . magnesium hydroxide (MILK OF MAGNESIA)  suspension 30 mL  30 mL Oral Daily PRN Hildred Priest, MD      . menthol-cetylpyridinium (CEPACOL) lozenge 3 mg  1 lozenge Oral PRN Hildred Priest, MD      . phenol (CHLORASEPTIC) mouth spray 1 spray  1 spray Mouth/Throat PRN Hildred Priest, MD   1 spray at 10/28/15 2113  . QUEtiapine (SEROQUEL) tablet 100 mg  100 mg Oral QHS Hildred Priest, MD      . traZODone (DESYREL) tablet 50 mg  50 mg Oral QHS PRN Hildred Priest, MD        Lab Results: No results found for this or any previous visit (from the past 48 hour(s)).  Blood Alcohol level:  Lab Results  Component Value Date   [facility] 271* 10/22/2015    Physical Findings: AIMS: Facial and Oral Movements Muscles of Facial Expression: None, normal Lips and Perioral Area: None, normal Jaw: None, normal Tongue: None, normal,Extremity Movements Upper (arms, wrists, hands, fingers): None, normal Lower (legs, knees, ankles, toes): None, normal, Trunk Movements Neck, shoulders, hips: None, normal, Overall Severity Severity of abnormal movements (highest score from questions above): None, normal Incapacitation due to abnormal movements: None, normal Patient's awareness of abnormal movements (rate only patient's report): No Awareness, Dental Status Current problems with teeth and/or dentures?: No Does patient usually wear dentures?: No  CIWA:    COWS:     Musculoskeletal: Strength & Muscle Tone: within normal limits Gait & Station: normal Patient leans: N/A  Psychiatric Specialty Exam: Physical Exam  Nursing note and vitals reviewed. Constitutional: She appears well-developed and well-nourished.  HENT:  Head: Normocephalic and atraumatic.  Eyes: Conjunctivae are normal. Pupils are equal, round, and reactive to light.  Neck: Normal range of motion.  Cardiovascular: Regular rhythm and normal heart sounds.   Respiratory: Effort normal. No respiratory distress.  GI: Soft.   Musculoskeletal: Normal range of motion.  Neurological: She is alert.  Skin: Skin is warm and dry.  Psychiatric: She has a normal mood and affect. Her behavior is normal. Judgment and thought content normal.    Review of Systems  Constitutional: Negative.   HENT: Negative.   Eyes: Negative.   Respiratory: Negative.   Cardiovascular: Negative.   Gastrointestinal: Negative.   Musculoskeletal: Negative.   Skin: Negative.   Neurological: Negative.   Psychiatric/Behavioral: Positive for depression. Negative for suicidal ideas, hallucinations, memory loss and substance abuse. The patient is nervous/anxious and has insomnia.     Blood pressure 141/92, pulse 89, temperature 98.4 F (36.9 C), temperature source Oral, resp. rate 18, height 5\' 10"  (1.778 m), weight 81.194 kg (179 lb), SpO2 96 %.Body mass index is 25.68 kg/(m^2).  General Appearance: Fairly Groomed  Eye Contact:  Good  Speech:  Clear and Coherent  Volume:  Decreased  Mood:  Depressed  Affect:  Blunt  Thought Process:  Goal Directed  Orientation:  Full (Time, Place, and Person)  Thought Content:  Negative  Suicidal Thoughts:  No  Homicidal Thoughts:  No  Memory:  Immediate;   Good Recent;   Fair Remote;   Fair  Judgement:  Fair  Insight:  Fair  Psychomotor Activity:  Decreased  Concentration:  Concentration: Fair  Recall:  [employer] of Knowledge:  Fair  Language:  Fair  Akathisia:  No  Handed:  Right  AIMS (if indicated):     Assets:  Communication Skills Desire for Improvement Housing Physical Health Resilience  ADL's:  Intact  Cognition:  WNL  Sleep:  Number of Hours: 8     Treatment Plan Summary:  Bipolar disorder type II current episode depressed: Patient will be started on Seroquel 100 mg by mouth daily at bedtime. She will be continued on Lamictal 25 mg a day  Labs I will order TSH and vitamin B12  I will discontinue Luvox at this patient was taking this medication prior to admission with no  benefit. I do not see any clear evidence of OCD  For sore throat secondary to intubation she'll be continued on Chloraseptic and cepacol  Disposition the patient will be discharged back to her home in [place] likely in the next 24 hours  Diet regular  Follow-up patient is states she has already an appointment at the [facility] in [place], MD 10/29/2015, 12:53 PM

## 2015-10-29 NOTE — Progress Notes (Signed)
Recreation Therapy Notes  INPATIENT RECREATION THERAPY ASSESSMENT  Patient Details Name: Jacqueline Greene MRN: [id number] DOB: 1970/08/20 Today's Date: 10/29/2015  Patient Stressors: Work, Other (Comment) (Lost job recently; moving )  Coping Skills:   Isolate, Arguments, Avoidance, Art/Dance, Talking, Music, Other (Comment) (Read, hike, nature, horses, gardening)  Personal Challenges: Anger, Concentration, Problem-Solving, Relationships, Self-Esteem/Confidence, Stress Management  Leisure Interests (2+):  Individual - Other (Comment) (Read, hike)  Awareness of Community Resources:  Yes  Community Resources:  Park, Other (Comment) (Horse rescue)  Current Use: Yes  If no, Barriers?:    Patient Strengths:  Sweet, compassionate, funny  Patient Identified Areas of Improvement:  "A lot"; Stress management  Current Recreation Participation:  Nothing  Patient Goal for Hospitalization:  Manage medication  City of Residence:  [place] of Residence:  [place]   Current [place] (including self-harm):  No  Current HI:  No  Consent to Intern Participation: N/A   Leonette Monarch, LRT/CTRS 10/29/2015, 12:49 PM

## 2015-10-29 NOTE — Progress Notes (Signed)
Pt denies SI, HI, AVH. Endorses depression. Pt requests to go home. Med and group compliant. Appropriate with staff and peers. Encouragement and support offered. Pt remains safe on unit with q 15 min checks.

## 2015-10-29 NOTE — BHH Suicide Risk Assessment (Signed)
[facility] INPATIENT:  Family/Significant Other Suicide Prevention Education  Suicide Prevention Education:  Education Completed; Mother, Beau Fanny,  (name of family member/significant other) has been identified by the patient as the family member/significant other with whom the patient will be residing, and identified as the person(s) who will aid the patient in the event of a mental health crisis (suicidal ideations/suicide attempt).  With written consent from the patient, the family member/significant other has been provided the following suicide prevention education, prior to the and/or following the discharge of the patient.  The suicide prevention education provided includes the following:  Suicide risk factors  Suicide prevention and interventions  National Suicide Hotline telephone number  [facility] assessment telephone number  [facility] Emergency Assistance [place] and/or Residential Mobile Crisis Unit telephone number  Request made of family/significant other to:  Remove weapons (e.g., guns, rifles, knives), all items previously/currently identified as safety concern.    Remove drugs/medications (over-the-counter, prescriptions, illicit drugs), all items previously/currently identified as a safety concern.  The family member/significant other verbalizes understanding of the suicide prevention education information provided.  The family member/significant other agrees to remove the items of safety concern listed above.  August Saucer, MSW, LCSW 10/29/2015, 3:50 PM

## 2015-10-29 NOTE — Plan of Care (Signed)
Problem: Coping: Goal: Ability to cope will improve Outcome: Progressing Patient states she  Has acquired coping skills while she has been here and will utilize them at home [facility] RN

## 2015-10-29 NOTE — Plan of Care (Signed)
Problem: [facility] Participation in Recreation Therapeutic Interventions Goal: STG-Patient will demonstrate improved self esteem by identif STG: Self-Esteem - Within 3 treatment sessions, patient will verbalize at least 5 positive affirmation statements in one treatment session to increase self-esteem post d/c.  Outcome: Completed/Met Date Met:  10/29/15 Treatment Session 1; Completed 1 out of 1: At approximately 2:55 pm, LRT met with patient in patient room. Patient verbalized 5 positive affirmation statements. Patient reported it felt "good". LRT encouraged patient to continue saying positive affirmation statements.  Leonette Monarch, LRT/CTRS 06.05.17 3:04 pm Goal: STG-Other Recreation Therapy Goal (Specify) STG: Stress Management - Within 3 treatment sessions, patient will verbalize understanding of the stress management techniques in one treatment session to increase stress management skills.  Outcome: Completed/Met Date Met:  10/29/15 Treatment Session 1; Completed 1 out of 1: At approximately 2:55 pm, LRT met with patient in patient room. LRT educated and provided patient with handouts on stress management techniques. Patient verbalized understanding. LRT encouraged patient to read over and practice the stress management techniques.  Leonette Monarch, LRT/CTRS 06.05.17 3:06 pm

## 2015-10-30 MED ORDER — QUETIAPINE FUMARATE 200 MG PO TABS: 100.0000 mg | ORAL_TABLET | Freq: Every day | ORAL | Status: DC

## 2015-10-30 MED ORDER — QUETIAPINE FUMARATE 200 MG PO TABS: 200.0000 mg | ORAL_TABLET | Freq: Every day | ORAL | Status: AC

## 2015-10-30 NOTE — Progress Notes (Signed)
  [facility] Adult Case Management Discharge Plan :  Will you be returning to the same living situation after discharge:  Yes,    At discharge, do you have transportation home?: Yes,    Do you have the ability to pay for your medications: Yes,     Release of information consent forms completed and in the chart;  Patient's signature needed at discharge.  Patient to Follow up at: Follow-up Information    Follow up with [place] . Go on 11/01/2015.   Why:   [place], [place] location for therapy on Thursday June 8th at [facility] with [facility] information:   [address] [place], [place] [address] [phone] Fax: [phone]      Follow up On 11/07/2015.   Why:  Please arrive to the [facility] on Wednesday June 14th at 4pm  for your assessment for medication management with Dr. Jeanette Caprice.    Contact information:   [address] [place], [place] [address] [phone] Fax: [phone]      Next level of care provider has access to [place] and Suicide Prevention discussed: Yes,     Have you used any form of tobacco in the last 30 days? (Cigarettes, Smokeless Tobacco, Cigars, and/or Pipes): No  Has patient been referred to the Quitline?: N/A patient is not a smoker  Patient has been referred for addiction treatment: Yes  Haydon Dorris, Carloyn Jaeger, MSW, LCSW 10/30/2015, 4:54 PM

## 2015-10-30 NOTE — BHH Suicide Risk Assessment (Signed)
[facility] Discharge Suicide Risk Assessment   Principal Problem: Bipolar 2 disorder, major depressive episode [facility] Discharge Diagnoses:  Patient Active Problem List   Diagnosis Date Noted  . Bipolar 2 disorder, major depressive episode ([place]) [F31.81] 10/29/2015  . Drug overdose, intentional ([place]) [T50.902A] 10/22/2015  . Arthritis, degenerative [M19.90] 02/18/2012      Psychiatric Specialty Exam: ROS  Blood pressure 105/73, pulse 76, temperature 98.4 F (36.9 C), temperature source Oral, resp. rate 18, height 5\' 10"  (1.778 m), weight 81.194 kg (179 lb), SpO2 96 %.Body mass index is 25.68 kg/(m^2).                                                       Mental Status Per Nursing Assessment::   On Admission:     Demographic Factors:  Caucasian and Living alone  Loss Factors: Financial problems/change in socioeconomic status  Historical Factors: Prior suicide attempts and Impulsivity  Risk Reduction Factors:   Sense of responsibility to family and Positive social support  Continued Clinical Symptoms:  Previous Psychiatric Diagnoses and Treatments  Cognitive Features That Contribute To Risk:  None    Suicide Risk:  Minimal: No identifiable suicidal ideation.  Patients presenting with no risk factors but with morbid ruminations; may be classified as minimal risk based on the severity of the depressive symptoms  Follow-up Information    Follow up with [place] . Go on 11/01/2015.   Why:   [place], [place] location for therapy on Thursday June 8th at [facility] with [facility] information:   [address] [place], [place] [address] [phone] Fax: [phone]      Follow up On 11/07/2015.   Why:  Please arrive to the [facility] on Wednesday June 14th at 4pm  for your assessment for medication management with Dr. Jeanette Caprice.    Contact information:   [address] [place], [place] [address] [phone] Fax: [phone]      Hildred Priest, MD 10/30/2015, 9:10 AM

## 2015-10-30 NOTE — Progress Notes (Signed)
Patient's discharge teachings and instructions discussed with patient, she verbalized understanding. Patient's belongings including property in safe and locker given back to patient at time of discharge.She voiced no concerns. Patient was walked to the lobby where her mother was waiting for her. Patient appreciative of the care received and was happy to be discharged.
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
## 2015-10-31 NOTE — Progress Notes (Signed)
Recreation Therapy Notes  INPATIENT RECREATION TR PLAN  Patient Details Name: Jacqueline Greene MRN: [id number] DOB: [birth year] Today's Date: 10/31/2015  Rec Therapy Plan Is patient appropriate for Therapeutic Recreation?: Yes Treatment times per week: At least once a week TR Treatment/Interventions: 1:1 session, Group participation (Comment) (Appropriate participation in daily recreaional therapy tx)  Discharge Criteria Pt will be discharged from therapy if:: Treatment goals are met, Discharged Treatment plan/goals/alternatives discussed and agreed upon by:: Patient/family  Discharge Summary Short term goals set: See Care Plan Short term goals met: Complete Progress toward goals comments: One-to-one attended Which groups?: Wellness One-to-one attended: Self-esteem, stress management Reason goals not met: N/A Therapeutic equipment acquired: None Reason patient discharged from therapy: Discharge from hospital Pt/family agrees with progress & goals achieved: Yes Date patient discharged from therapy: 10/30/15   Leonette Monarch, LRT/CTRS 10/31/2015, 9:14 AM

## 2018-02-07 IMAGING — CT CT HEAD W/O CM
2 series · 16 of 30 positions shown, 20 images · non-contrast
Comparison: None.

CLINICAL DATA: The bottle high overdose.

EXAM:
CT HEAD WITHOUT CONTRAST
TECHNIQUE: Contiguous axial images were obtained from the base of the skull
through the vertex without intravenous contrast.

[Series 2: head w/o · axial · non-contrast · 0.45mm/px · z∈[+1159,+1289]mm · 13 of 32 slices shown, 17 images]
[im 3/32  brain]
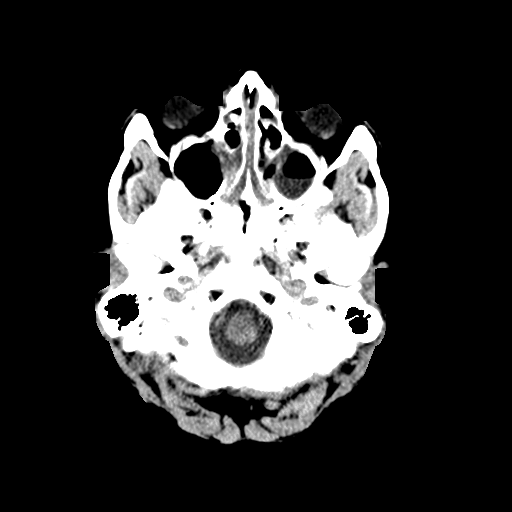
[im 3/32  bone]
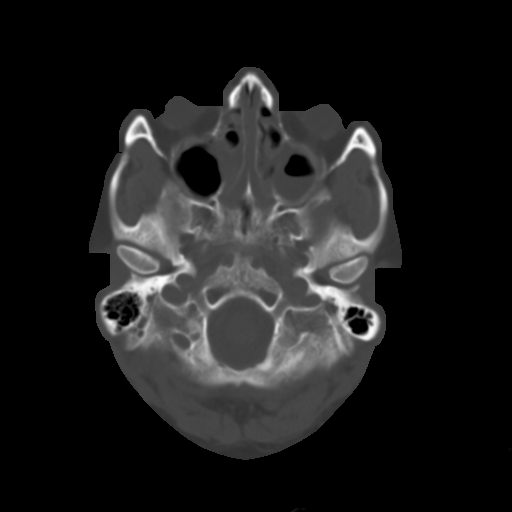
[im 5/32  brain]
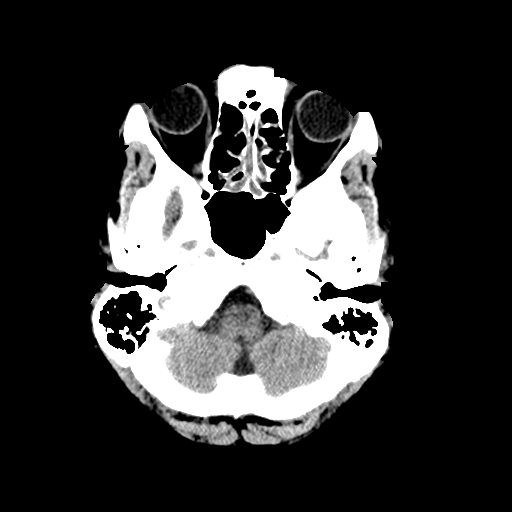
[im 7/32  brain]
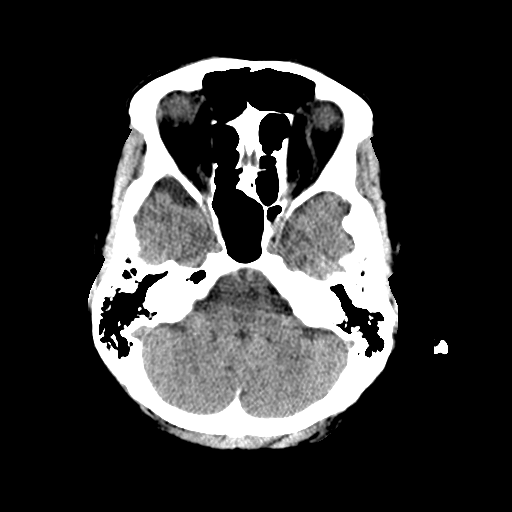
[im 9/32  brain]
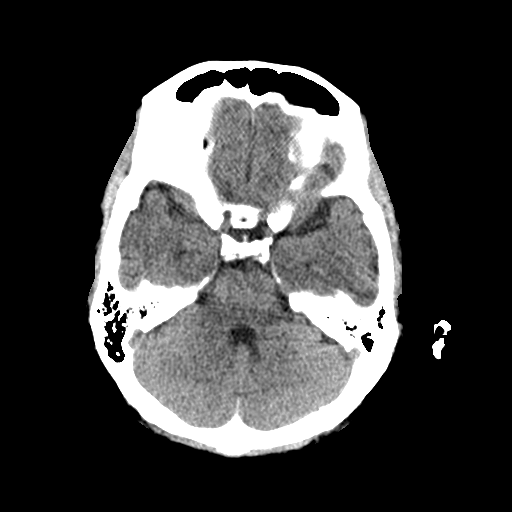
[im 12/32  brain]
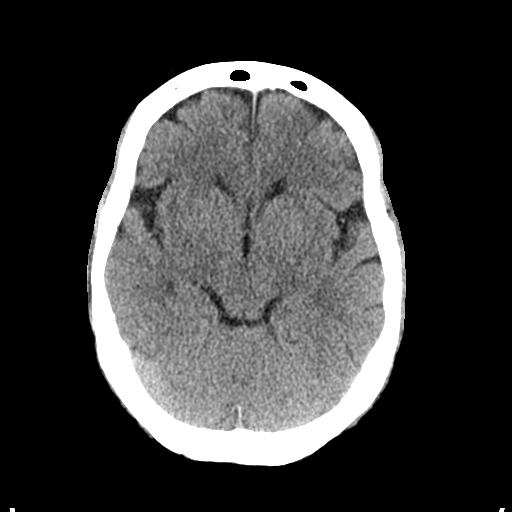
[im 12/32  bone]
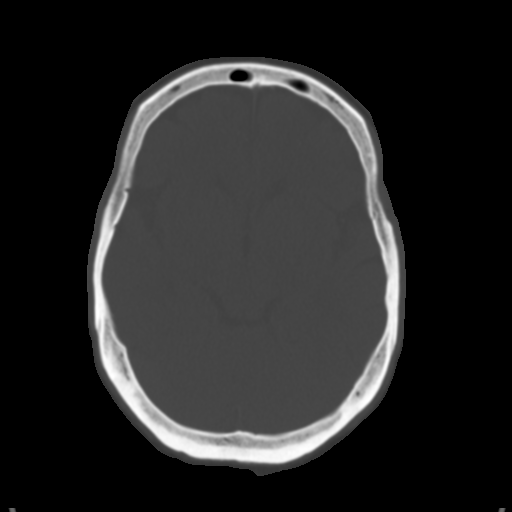
[im 14/32  brain]
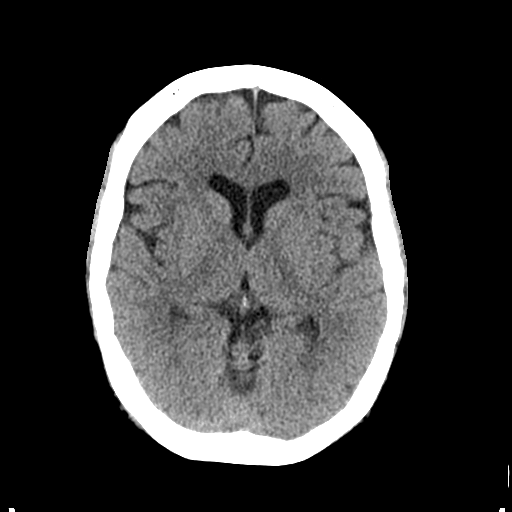
[im 16/32  brain]
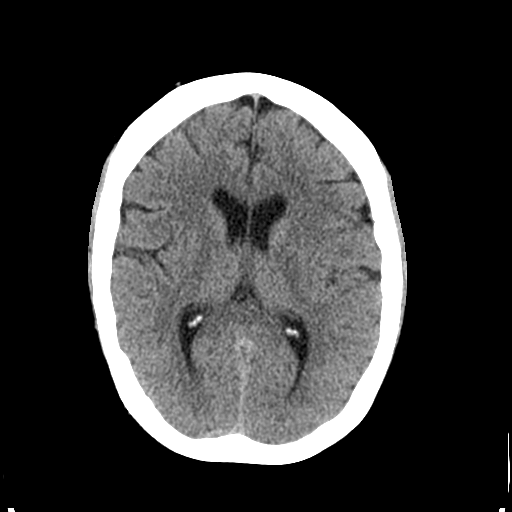
[im 18/32  brain]
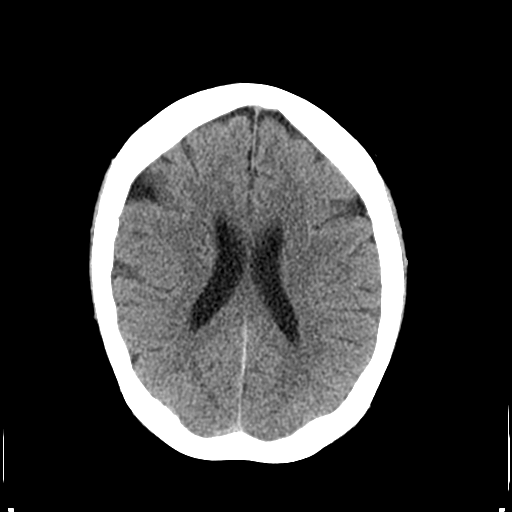
[im 20/32  brain]
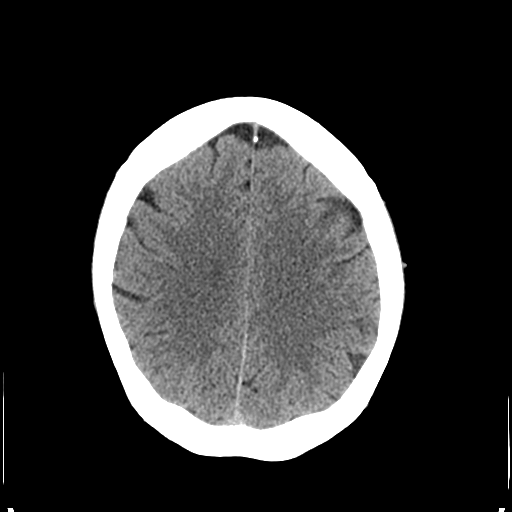
[im 20/32  bone]
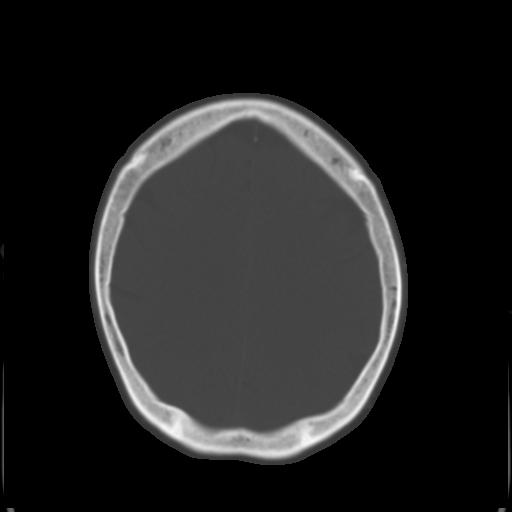
[im 23/32  brain]
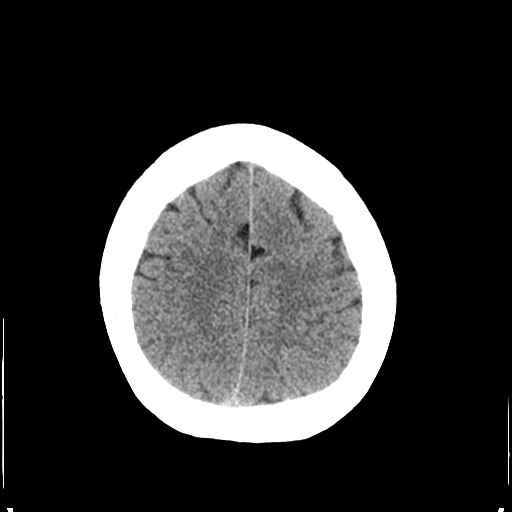
[im 25/32  brain]
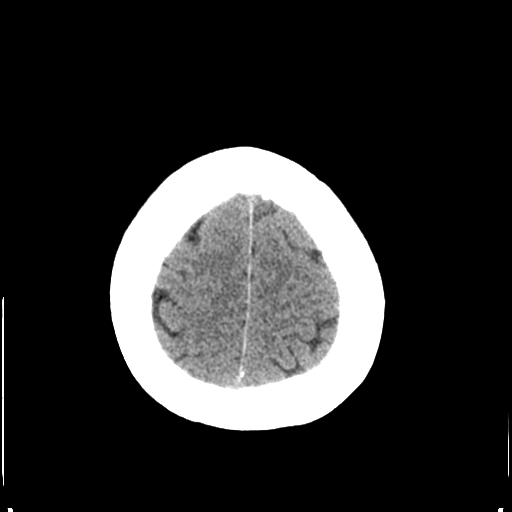
[im 27/32  brain]
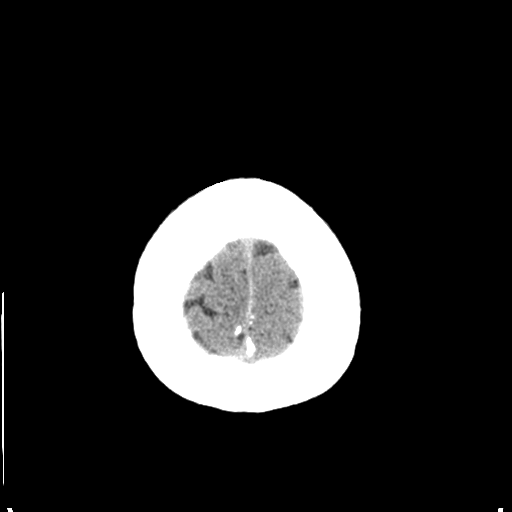
[im 29/32  brain]
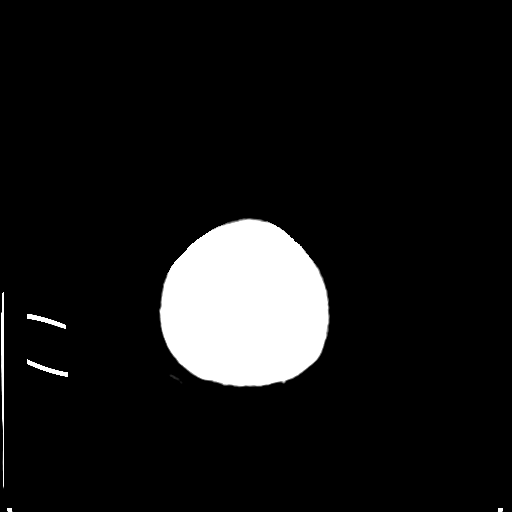
[im 29/32  bone]
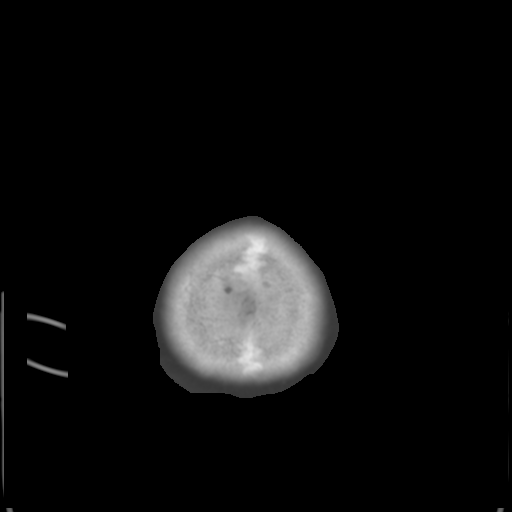

[Series 3: bone windows · axial · 0.45mm/px · z∈[+1159,+1204]mm · 3 of 32 slices shown]
[im 3/32  bone]
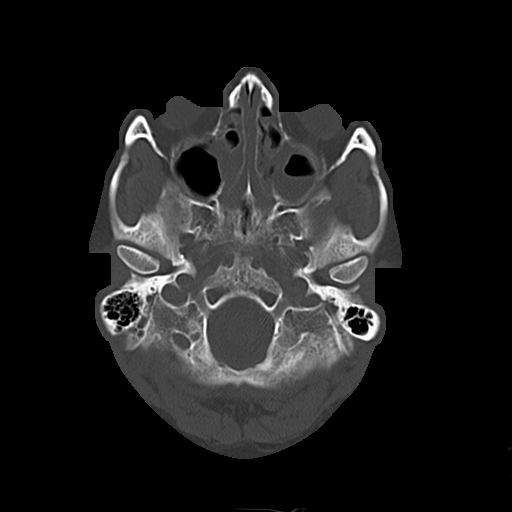
[im 7/32  bone]
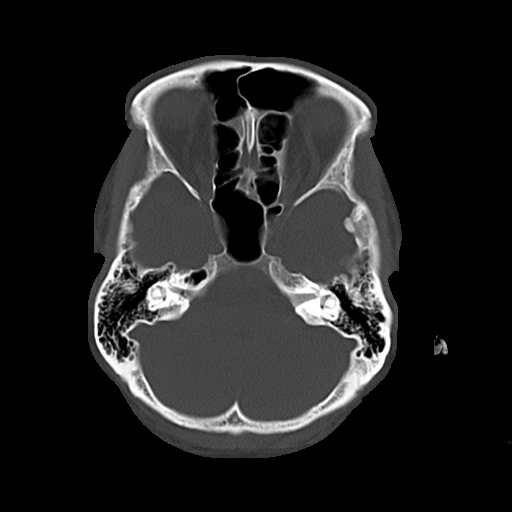
[im 12/32  bone]
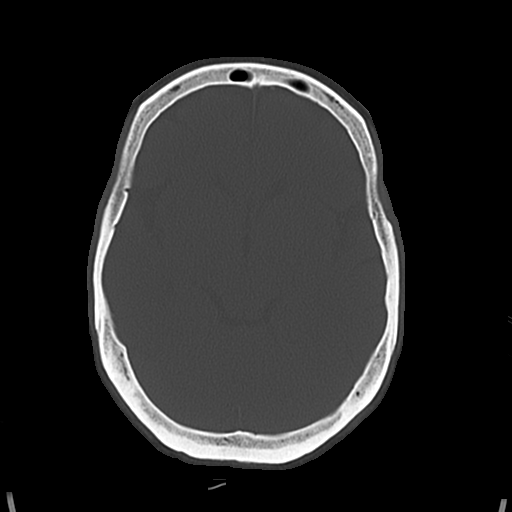

[16 of 30 positions shown; findings below may reference images not displayed]

FINDINGS: There is no evidence of mass effect, midline shift or extra-axial
fluid collections. There is no evidence of a space-occupying lesion
or intracranial hemorrhage. There is no evidence of a cortical-based
area of acute infarction.

The ventricles and sulci are appropriate for the patient's age. The
basal cisterns are patent.

Visualized portions of the orbits are unremarkable. There is
air-fluid level in the left maxillary sinus. There is mucosal
thickening in the maxillary sinuses and ethmoid sinuses.

The osseous structures are unremarkable.
IMPRESSION: 1. No acute intracranial pathology.
2. Sinus disease as described above.

## 2018-02-08 IMAGING — DX DG CHEST 1V PORT
1 series · 1 of 1 positions shown · non-contrast
Comparison: 10/22/2015.

CLINICAL DATA: Asthma exacerbation. Ventilator dependent
respiratory failure. Followup atelectasis.

EXAM:
PORTABLE CHEST 1 VIEW

[chest ap]
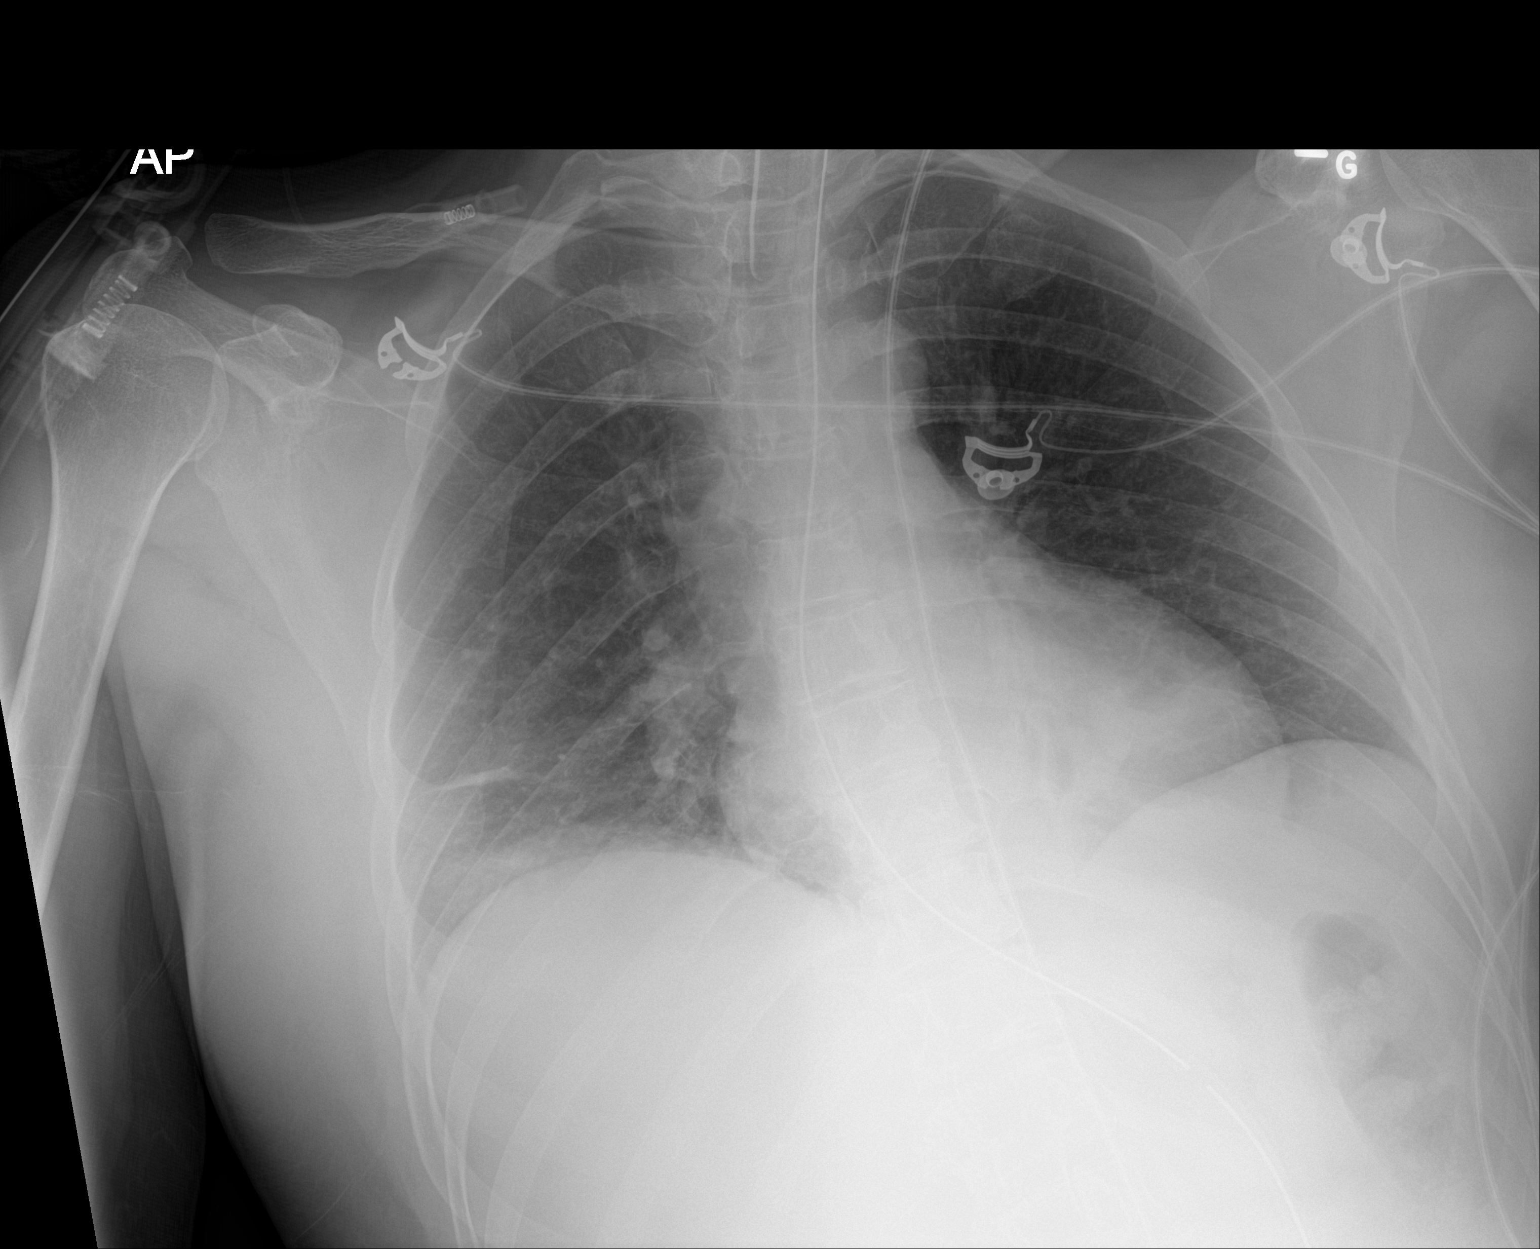

[1 of 1 positions shown; findings below may reference images not displayed]

FINDINGS: Endotracheal tube tip in satisfactory position projecting
approximately 6 cm above the carina. Nasogastric tube courses below
the diaphragm into the stomach. Suboptimal inspiration with
atelectasis in the lung bases, slightly increased since yesterday.
Cardiac silhouette normal in size. Pulmonary vascularity normal. No
visible pleural effusions.
IMPRESSION: 1. Support apparatus satisfactory.
2. Worsening bibasilar atelectasis.
